# Patient Record
Sex: Female | Born: 1965 | Race: White | Hispanic: No | Smoking: Former smoker
Health system: Southern US, Community
[De-identification: ages and names within clinical notes are randomized; demographics above are authoritative.]

## PROBLEM LIST (undated history)

## (undated) DIAGNOSIS — G589 Mononeuropathy, unspecified: Secondary | ICD-10-CM

## (undated) DIAGNOSIS — F32A Depression, unspecified: Secondary | ICD-10-CM

## (undated) DIAGNOSIS — I10 Essential (primary) hypertension: Secondary | ICD-10-CM

## (undated) DIAGNOSIS — M1990 Unspecified osteoarthritis, unspecified site: Secondary | ICD-10-CM

## (undated) DIAGNOSIS — D219 Benign neoplasm of connective and other soft tissue, unspecified: Secondary | ICD-10-CM

## (undated) DIAGNOSIS — E119 Type 2 diabetes mellitus without complications: Secondary | ICD-10-CM

## (undated) DIAGNOSIS — F419 Anxiety disorder, unspecified: Secondary | ICD-10-CM

## (undated) DIAGNOSIS — N200 Calculus of kidney: Secondary | ICD-10-CM

## (undated) DIAGNOSIS — F329 Major depressive disorder, single episode, unspecified: Secondary | ICD-10-CM

## (undated) DIAGNOSIS — S149XXA Injury of unspecified nerves of neck, initial encounter: Secondary | ICD-10-CM

## (undated) DIAGNOSIS — K219 Gastro-esophageal reflux disease without esophagitis: Secondary | ICD-10-CM

## (undated) DIAGNOSIS — D649 Anemia, unspecified: Secondary | ICD-10-CM

## (undated) DIAGNOSIS — R202 Paresthesia of skin: Secondary | ICD-10-CM

## (undated) HISTORY — DX: Benign neoplasm of connective and other soft tissue, unspecified: D21.9

## (undated) HISTORY — DX: Anemia, unspecified: D64.9

## (undated) HISTORY — PX: ABDOMINAL HYSTERECTOMY: SHX81

## (undated) HISTORY — DX: Anxiety disorder, unspecified: F41.9

## (undated) HISTORY — PX: TUBAL LIGATION: SHX77

## (undated) HISTORY — DX: Major depressive disorder, single episode, unspecified: F32.9

## (undated) HISTORY — DX: Depression, unspecified: F32.A

## (undated) HISTORY — DX: Mononeuropathy, unspecified: G58.9

## (undated) HISTORY — DX: Paresthesia of skin: R20.2

## (undated) HISTORY — DX: Unspecified osteoarthritis, unspecified site: M19.90

## (undated) HISTORY — PX: OTHER SURGICAL HISTORY: SHX169

## (undated) HISTORY — PX: NECK SURGERY: SHX720

## (undated) HISTORY — DX: Injury of unspecified nerves of neck, initial encounter: S14.9XXA

## (undated) HISTORY — DX: Gastro-esophageal reflux disease without esophagitis: K21.9

---

## 2007-06-30 ENCOUNTER — Ambulatory Visit (HOSPITAL_COMMUNITY): Admission: RE | Admit: 2007-06-30 | Discharge: 2007-06-30 | Payer: Self-pay | Admitting: Family Medicine

## 2009-09-07 ENCOUNTER — Emergency Department (HOSPITAL_COMMUNITY): Admission: EM | Admit: 2009-09-07 | Discharge: 2009-09-07 | Payer: Self-pay | Admitting: Emergency Medicine

## 2014-08-04 ENCOUNTER — Emergency Department (HOSPITAL_COMMUNITY)
Admission: EM | Admit: 2014-08-04 | Discharge: 2014-08-05 | Disposition: A | Discharge status: Home / Self Care | Payer: PRIVATE HEALTH INSURANCE | Attending: Emergency Medicine | Admitting: Emergency Medicine

## 2014-08-04 ENCOUNTER — Emergency Department (HOSPITAL_COMMUNITY): Payer: PRIVATE HEALTH INSURANCE

## 2014-08-04 ENCOUNTER — Encounter (HOSPITAL_COMMUNITY): Payer: Self-pay | Admitting: Emergency Medicine

## 2014-08-04 ENCOUNTER — Other Ambulatory Visit (HOSPITAL_COMMUNITY): Payer: Self-pay | Admitting: Internal Medicine

## 2014-08-04 DIAGNOSIS — Z9889 Other specified postprocedural states: Secondary | ICD-10-CM | POA: Diagnosis not present

## 2014-08-04 DIAGNOSIS — Z90710 Acquired absence of both cervix and uterus: Secondary | ICD-10-CM | POA: Insufficient documentation

## 2014-08-04 DIAGNOSIS — K6289 Other specified diseases of anus and rectum: Secondary | ICD-10-CM | POA: Diagnosis not present

## 2014-08-04 DIAGNOSIS — R110 Nausea: Secondary | ICD-10-CM | POA: Diagnosis not present

## 2014-08-04 DIAGNOSIS — Z87442 Personal history of urinary calculi: Secondary | ICD-10-CM | POA: Diagnosis not present

## 2014-08-04 DIAGNOSIS — N949 Unspecified condition associated with female genital organs and menstrual cycle: Secondary | ICD-10-CM

## 2014-08-04 DIAGNOSIS — Z79899 Other long term (current) drug therapy: Secondary | ICD-10-CM | POA: Diagnosis not present

## 2014-08-04 DIAGNOSIS — R109 Unspecified abdominal pain: Secondary | ICD-10-CM | POA: Insufficient documentation

## 2014-08-04 HISTORY — DX: Calculus of kidney: N20.0

## 2014-08-04 LAB — BASIC METABOLIC PANEL
Anion gap: 11 (ref 5–15)
BUN: 10 mg/dL (ref 6–23)
CHLORIDE: 104 meq/L (ref 96–112)
CO2: 24 meq/L (ref 19–32)
Calcium: 8.8 mg/dL (ref 8.4–10.5)
Creatinine, Ser: 0.57 mg/dL (ref 0.50–1.10)
GFR calc Af Amer: >90 mL/min (ref >90)
GFR calc non Af Amer: >90 mL/min (ref >90)
GLUCOSE: 102 mg/dL — AB (ref 70–99)
POTASSIUM: 3.9 meq/L (ref 3.7–5.3)
SODIUM: 139 meq/L (ref 137–147)

## 2014-08-04 LAB — CBC WITH DIFFERENTIAL/PLATELET
Basophils Absolute: 0 10*3/uL (ref 0.0–0.1)
Basophils Relative: 1 % (ref 0–1)
Eosinophils Absolute: 0.2 10*3/uL (ref 0.0–0.7)
Eosinophils Relative: 2 % (ref 0–5)
HCT: 38.8 % (ref 36.0–46.0)
HEMOGLOBIN: 13.9 g/dL (ref 12.0–15.0)
LYMPHS ABS: 2.8 10*3/uL (ref 0.7–4.0)
LYMPHS PCT: 37 % (ref 12–46)
MCH: 31.2 pg (ref 26.0–34.0)
MCHC: 35.8 g/dL (ref 30.0–36.0)
MCV: 87.2 fL (ref 78.0–100.0)
Monocytes Absolute: 0.5 10*3/uL (ref 0.1–1.0)
Monocytes Relative: 6 % (ref 3–12)
NEUTROS PCT: 54 % (ref 43–77)
Neutro Abs: 4.1 10*3/uL (ref 1.7–7.7)
PLATELETS: 245 10*3/uL (ref 150–400)
RBC: 4.45 MIL/uL (ref 3.87–5.11)
RDW: 12.6 % (ref 11.5–15.5)
WBC: 7.6 10*3/uL (ref 4.0–10.5)

## 2014-08-04 MED ORDER — MORPHINE SULFATE 4 MG/ML IJ SOLN
4.0000 mg | INTRAMUSCULAR | Status: DC | PRN
Start: 2014-08-04 — End: 2014-08-05
  Administered 2014-08-04: 4 mg via INTRAVENOUS
  Filled 2014-08-04: qty 1

## 2014-08-04 MED ORDER — ONDANSETRON HCL 4 MG/2ML IJ SOLN
4.0000 mg | Freq: Once | INTRAMUSCULAR | Status: AC
  Administered 2014-08-04: 4 mg via INTRAVENOUS
  Filled 2014-08-04: qty 2

## 2014-08-04 NOTE — ED Provider Notes (Addendum)
CSN: [id number]     Arrival date & time 08/04/14  2101 History  This chart was scribed for Christy Furry, MD by Randa Evens, ED Scribe. This patient was seen in room APA05/APA05 and the patient's care was started at 9:37 PM.    Chief Complaint  Patient presents with  . Rectal Pain  . Abdominal Pain   Patient is a 48 y.o. female presenting with abdominal pain. The history is provided by the patient. No language interpreter was used.  Abdominal Pain Associated symptoms: nausea   Associated symptoms: no chest pain, no chills, no cough, no diarrhea, no dysuria, no fatigue, no fever, no hematuria, no shortness of breath, no sore throat and no vomiting    HPI Comments: Christy Russo is a 48 y.o. female who presents to the Emergency Department complaining of shooting rectal pain onset 6 days prior. She states she has been having associated abdominal pain and nausea.  She states she is having bowel movements. She states that her symptoms worsens with moving and sitting up. She states that laying down improves her symptoms. States she went to her PCP for UTI and the results were negative. States she was treated for UTI any way. She states she has taken a laxative earlier this week with no relief.  Denies diarrhea, blood in stool or fever.   Past Medical History  Diagnosis Date  . Kidney stones    Past Surgical History  Procedure Laterality Date  . Abdominal hysterectomy    . C-section x 2     History reviewed. No pertinent family history. History  Substance Use Topics  . Smoking status: Never Smoker   . Smokeless tobacco: Not on file  . Alcohol Use: Not on file   OB History   Grav Para Term Preterm Abortions TAB SAB Ect Mult Living                 Review of Systems  Constitutional: Negative for fever, chills, diaphoresis, appetite change and fatigue.  HENT: Negative for mouth sores, sore throat and trouble swallowing.   Eyes: Negative for visual disturbance.  Respiratory:  Negative for cough, chest tightness, shortness of breath and wheezing.   Cardiovascular: Negative for chest pain.  Gastrointestinal: Positive for nausea, abdominal pain and rectal pain. Negative for vomiting, diarrhea, blood in stool, abdominal distention and anal bleeding.  Endocrine: Negative for polydipsia, polyphagia and polyuria.  Genitourinary: Negative for dysuria, frequency and hematuria.  Musculoskeletal: Negative for gait problem.  Skin: Negative for color change, pallor and rash.  Neurological: Negative for dizziness, syncope, light-headedness and headaches.  Hematological: Does not bruise/bleed easily.  Psychiatric/Behavioral: Negative for behavioral problems and confusion.    Allergies  Review of patient's allergies indicates no known allergies.  Home Medications   Prior to Admission medications   Medication Sig Start Date End Date Taking? Authorizing Provider  ALPRAZolam Duanne Moron) 1 MG tablet Take 1 mg by mouth at bedtime.   Yes Historical Provider, MD  DULoxetine (CYMBALTA) 20 MG capsule Take 20 mg by mouth daily.   Yes Historical Provider, MD  oxyCODONE-acetaminophen (PERCOCET/ROXICET) 5-325 MG per tablet Take 2 tablets by mouth every 4 (four) hours as needed. 08/05/14   Christy Furry, MD   Triage Vitals: BP 129/87  Pulse 71  Temp(Src) 98.5 F (36.9 C) (Oral)  Resp 20  Ht 5\' 4"  (1.626 m)  Wt 148 lb (67.132 kg)  BMI 25.39 kg/m2  SpO2 100%  Physical Exam  Constitutional: She is oriented to  person, place, and time. She appears well-developed and well-nourished. No distress.  HENT:  Head: Normocephalic.  Eyes: Conjunctivae are normal. Pupils are equal, round, and reactive to light. No scleral icterus.  Neck: Normal range of motion. Neck supple. No thyromegaly present.  Cardiovascular: Normal rate and regular rhythm.  Exam reveals no gallop and no friction rub.   No murmur heard. Pulmonary/Chest: Effort normal and breath sounds normal. No respiratory distress. She has no  wheezes. She has no rales.  Abdominal: Soft. Bowel sounds are normal. She exhibits no distension. There is no rebound.  Reports Suprapubic pain but no tenderness on exam  Musculoskeletal: Normal range of motion.  Neurological: She is alert and oriented to person, place, and time.  Skin: Skin is warm and dry. No rash noted.  Psychiatric: She has a normal mood and affect. Her behavior is normal.    ED Course  Procedures (including critical care time) DIAGNOSTIC STUDIES: Oxygen Saturation is 100% on RA, Normal by my interpretation.    COORDINATION OF CARE:      Labs Review Labs Reviewed  BASIC METABOLIC PANEL - Abnormal; Notable for the following:    Glucose, Bld 102 (*)    All other components within normal limits  CBC WITH DIFFERENTIAL  URINALYSIS, ROUTINE W REFLEX MICROSCOPIC    Imaging Review Ct Abdomen Pelvis Wo Contrast  08/04/2014   CLINICAL DATA:  Perirectal pain and suprapubic pain. Treated for urinary tract infection earlier this week.  EXAM: CT ABDOMEN AND PELVIS WITHOUT CONTRAST  TECHNIQUE: Multidetector CT imaging of the abdomen and pelvis was performed following the standard protocol without IV contrast.  COMPARISON:  None.  FINDINGS: Lung bases are clear.  Kidneys appear symmetrical. No pyelocaliectasis or ureterectasis. No renal, ureteral, or bladder stones. Bladder is decompressed.  The unenhanced appearance of the liver, spleen, gallbladder, pancreas, adrenal glands, abdominal aorta, inferior vena cava, and retroperitoneal lymph nodes is unremarkable. There is a persistent left-sided inferior vena cava. This represents normal variation. The stomach and small bowel are decompressed. Stool-filled colon without distention. No free air or free fluid in the abdomen. Small accessory spleen.  Pelvis: The appendix is normal. No inflammatory changes to suggest diverticulitis. No free or loculated pelvic fluid collections. Uterus appears to be surgically absent. No pelvic mass or  lymphadenopathy. No destructive bone lesions.  IMPRESSION: No renal or ureteral stone or obstruction.   Electronically Signed   By: Lucienne Capers M.D.   On: 08/04/2014 23:53     EKG Interpretation None      MDM   Final diagnoses:  Abdominal pain, unspecified abdominal location    Specific cause noted for the patient's pain. Normal CT scan. Normal perirectal exam. No sign of abscess formation. Not constipated. No tears fissures or hemorrhoid noted. Think she is appropriate to be discharged to followup with her primary care physician for her pelvic ultrasound tomorrow.    I personally performed the services described in this documentation, which was scribed in my presence. The recorded information has been reviewed and is accurate.      Christy Furry, MD 08/05/14 0002  Christy Furry, MD 08/05/14 Dyann Kief

## 2014-08-04 NOTE — ED Notes (Signed)
Pt with rectal and abdominal pain. States she saw Dr. Gerarda Fraction on Thurs because she thought she had a "UTI or something". He prescribed her and ABX profolactically. Pt states she is feeling bloated. Pt states she has been taking laxatives thinking it would make her feel better but it hasn't. Pt descibes stabbing rectal pain. Denies blood in stool. Last BM today.

## 2014-08-05 ENCOUNTER — Ambulatory Visit (HOSPITAL_COMMUNITY)
Admission: RE | Admit: 2014-08-05 | Discharge: 2014-08-05 | Disposition: A | Discharge status: Home / Self Care | Payer: PRIVATE HEALTH INSURANCE | Source: Ambulatory Visit | Attending: Internal Medicine | Admitting: Internal Medicine

## 2014-08-05 DIAGNOSIS — N949 Unspecified condition associated with female genital organs and menstrual cycle: Secondary | ICD-10-CM | POA: Insufficient documentation

## 2014-08-05 DIAGNOSIS — N83209 Unspecified ovarian cyst, unspecified side: Secondary | ICD-10-CM | POA: Insufficient documentation

## 2014-08-05 DIAGNOSIS — N940 Mittelschmerz: Secondary | ICD-10-CM | POA: Diagnosis not present

## 2014-08-05 MED ORDER — OXYCODONE-ACETAMINOPHEN 5-325 MG PO TABS
1.0000 | ORAL_TABLET | Freq: Once | ORAL | Status: AC
  Administered 2014-08-05: 1 via ORAL

## 2014-08-05 MED ORDER — OXYCODONE-ACETAMINOPHEN 5-325 MG PO TABS
ORAL_TABLET | ORAL | Status: DC
Start: 2014-08-05 — End: 2014-08-05
  Filled 2014-08-05: qty 1

## 2014-08-05 MED ORDER — OXYCODONE-ACETAMINOPHEN 5-325 MG PO TABS: 2.0000 | ORAL_TABLET | ORAL | Status: DC | PRN

## 2014-08-05 NOTE — Discharge Instructions (Signed)
No specific cause was found for the abdominal pain tonight. Keep your appointment for your Ultrasound tomorrow. Recheck with Dr. Gerarda Fraction.  Abdominal Pain Many things can cause abdominal pain. Usually, abdominal pain is not caused by a disease and will improve without treatment. It can often be observed and treated at home. Your health care provider will do a physical exam and possibly order blood tests and X-rays to help determine the seriousness of your pain. However, in many cases, more time must pass before a clear cause of the pain can be found. Before that point, your health care provider may not know if you need more testing or further treatment. HOME CARE INSTRUCTIONS  Monitor your abdominal pain for any changes. The following actions may help to alleviate any discomfort you are experiencing:  Only take over-the-counter or prescription medicines as directed by your health care provider.  Do not take laxatives unless directed to do so by your health care provider.  Try a clear liquid diet (broth, tea, or water) as directed by your health care provider. Slowly move to a bland diet as tolerated. SEEK MEDICAL CARE IF:  You have unexplained abdominal pain.  You have abdominal pain associated with nausea or diarrhea.  You have pain when you urinate or have a bowel movement.  You experience abdominal pain that wakes you in the night.  You have abdominal pain that is worsened or improved by eating food.  You have abdominal pain that is worsened with eating fatty foods.  You have a fever. SEEK IMMEDIATE MEDICAL CARE IF:   Your pain does not go away within 2 hours.  You keep throwing up (vomiting).  Your pain is felt only in portions of the abdomen, such as the right side or the left lower portion of the abdomen.  You pass bloody or black tarry stools. MAKE SURE YOU:  Understand these instructions.   Will watch your condition.   Will get help right away if you are not doing  well or get worse.  Document Released: 09/05/2005 Document Revised: 12/01/2013 Document Reviewed: 08/05/2013 [facility] Patient Information 2015 [place], [place]. This information is not intended to replace advice given to you by your health care provider. Make sure you discuss any questions you have with your health care provider.

## 2014-08-06 ENCOUNTER — Other Ambulatory Visit (HOSPITAL_COMMUNITY): Payer: Self-pay

## 2015-01-10 ENCOUNTER — Ambulatory Visit (HOSPITAL_COMMUNITY)
Admission: RE | Admit: 2015-01-10 | Discharge: 2015-01-10 | Disposition: A | Discharge status: Home / Self Care | Payer: PRIVATE HEALTH INSURANCE | Source: Ambulatory Visit | Attending: Family Medicine | Admitting: Family Medicine

## 2015-01-10 ENCOUNTER — Other Ambulatory Visit (HOSPITAL_COMMUNITY): Payer: Self-pay | Admitting: Family Medicine

## 2015-01-10 DIAGNOSIS — R059 Cough, unspecified: Secondary | ICD-10-CM

## 2015-01-10 DIAGNOSIS — R05 Cough: Secondary | ICD-10-CM | POA: Diagnosis present

## 2015-02-01 ENCOUNTER — Institutional Professional Consult (permissible substitution): Payer: Self-pay | Admitting: Neurology

## 2016-03-05 ENCOUNTER — Encounter: Payer: Self-pay | Admitting: Podiatry

## 2016-03-05 ENCOUNTER — Ambulatory Visit (INDEPENDENT_AMBULATORY_CARE_PROVIDER_SITE_OTHER): Payer: BLUE CROSS/BLUE SHIELD | Admitting: Podiatry

## 2016-03-05 ENCOUNTER — Ambulatory Visit: Payer: BLUE CROSS/BLUE SHIELD

## 2016-03-05 DIAGNOSIS — R52 Pain, unspecified: Secondary | ICD-10-CM

## 2016-03-05 DIAGNOSIS — M2042 Other hammer toe(s) (acquired), left foot: Secondary | ICD-10-CM | POA: Diagnosis not present

## 2016-03-05 NOTE — Patient Instructions (Signed)

## 2016-03-05 NOTE — Progress Notes (Signed)
Subjective:    Patient ID: Christy Russo, female    DOB: 1966-02-27, 50 y.o.   MRN: [id number]  HPI  50 year old female presents the also concerns of pain to the left third toe. She states that she has a history of a fracture to the third toe due to a fall. At that time she did not go to the doctor until after some. She was given a splint however did not help. She is also tried shoe changes and offloading padding. Since that she has noticed the tip of her left third toe is curling and she is having pain daily basis with pressure and weightbearing. She also gets numbness the tip of the toe.   She is currently being treated by Dr. Gershon Mussel for a left ankle injury. She is wearing a brace. She's had 2 injections by him. She just had one in the last couple of days.   Review of Systems  All other systems reviewed and are negative.      Objective:   Physical Exam General: AAO x3, NAD  Dermatological: Skin is warm, dry and supple bilateral. Nails x 10 are well manicured; remaining integument appears unremarkable at this time. There are no open sores, no preulcerative lesions, no rash or signs of infection present.  Vascular: Dorsalis Pedis artery and Posterior Tibial artery pedal pulses are 2/4 bilateral with immedate capillary fill time. Pedal hair growth present. No varicosities and no lower extremity edema present bilateral. There is no pain with calf compression, swelling, warmth, erythema.   Neruologic: Grossly intact via light touch bilateral. Vibratory intact via tuning fork bilateral. Protective threshold with Semmes Wienstein monofilament intact to all pedal sites bilateral. Patellar and Achilles deep tendon reflexes 2+ bilateral. No Babinski or clonus noted bilateral.   Musculoskeletal: The left third toe has flexion contracture present at the DIPJ and is laterally deviated. There is tenderness to palpation on the distal aspect of the toe as she is walking on the very distal part of  the toe. There is no swelling or erythema. No open lesions. There is tenderness along the lateral aspect of left ankle without any pinpoint bony tenderness. There is bruising from recent injection. MMT 5/5.  Gait: Unassisted, Nonantalgic.       Assessment & Plan:  50 year old female left third symptomatic hammertoe -Treatment options discussed including all alternatives, risks, and complications -Etiology of symptoms were discussed -X-rays were obtained and reviewed with the patient. The patient be subluxation of the DIPJ of the left third digit. -At this time a discussed both conservative and surgical treatment options. This time she has attempted conservative treatment including bracing, offloading padding pain relief of symptoms. At this time she is requesting surgical intervention. I discussed with her left third digit DIPJ arthroplasty. -The incision placement as well as the postoperative course was discussed with the patient. I discussed risks of the surgery which include, but not limited to, infection, bleeding, pain, swelling, need for further surgery, delayed or nonhealing, painful or ugly scar, numbness or sensation changes, over/under correction, recurrence, transfer lesions, further deformity, hardware failure, DVT/PE, loss of toe/foot. Patient understands these risks and wishes to proceed with surgery. The surgical consent was reviewed with the patient all 3 pages were signed. No promises or guarantees were given to the outcome of the procedure. All questions were answered to the best of my ability. Before the surgery the patient was encouraged to call the office if there is any further questions. The surgery will be  performed at the [facility] on an outpatient basis. -Continue follow with Dr. Grandville Silos left ankle injury. We'll defer treatment to him for this.  Celesta Gentile, DPM

## 2016-03-06 DIAGNOSIS — M2040 Other hammer toe(s) (acquired), unspecified foot: Secondary | ICD-10-CM | POA: Insufficient documentation

## 2016-03-09 ENCOUNTER — Telehealth: Payer: Self-pay | Admitting: *Deleted

## 2016-03-09 DIAGNOSIS — M2042 Other hammer toe(s) (acquired), left foot: Secondary | ICD-10-CM

## 2016-03-09 NOTE — Telephone Encounter (Signed)
"  I was there on Monday and scheduled surgery for April 12.  I still haven't heard from the surgery center about the time."  Surgical center will call you Friday or Monday prior to surgery with the arrival time.  "Okay, thank you so much."

## 2016-03-21 ENCOUNTER — Encounter: Payer: Self-pay | Admitting: Podiatry

## 2016-03-21 ENCOUNTER — Telehealth: Payer: Self-pay | Admitting: *Deleted

## 2016-03-21 DIAGNOSIS — M2042 Other hammer toe(s) (acquired), left foot: Secondary | ICD-10-CM | POA: Diagnosis not present

## 2016-03-21 HISTORY — PX: TOE SURGERY: SHX1073

## 2016-03-21 NOTE — Telephone Encounter (Addendum)
Pt states her dislocated surgery toe is very itchy is that normal.  I told pt that may be the sensation she has to the numbness leaving the toe, to watch for rash, swelling and could use the OTC Benadryl for the itching.  04/04/2016-Pt states she was in office 04/02/2016 and the dressing felt tight at that time but she thought it would loosen, but it hasn't and she is having burning in toes and the bottom of her foot that didn't have surgery.  I told pt she could loosen the Coflex by making a knick in the area where it was tight, or could remove the Coflex and rewrap looser and tape if needed.  Pt states understanding and will come in for a dressing change after seeing Dr. Gershon Mussel tomorrow to pick up orthotics.  04/05/2016-Pt states she wasn't able to sleep last night, due to the burning across the bottom of the toes, the toes aren't as cramped since she loosened the Coflex, but the burning has worsened.  Transferred to schedulers for an appt today.  04/11/2016-Mr. Skinner, pt's employer states he is calling to expedite pt's Short-term Disability, which is with [employer], which needs confirmation of the surgery date.  Please call Mr. Polly Cobia as well. 04/18/2016-Pt request call from [place] at her convenience.  04/23/2016-Pt request Gretta Arab call at her convenience.

## 2016-03-26 ENCOUNTER — Encounter: Payer: Self-pay | Admitting: Podiatry

## 2016-03-28 ENCOUNTER — Ambulatory Visit (INDEPENDENT_AMBULATORY_CARE_PROVIDER_SITE_OTHER): Payer: BLUE CROSS/BLUE SHIELD | Admitting: Podiatry

## 2016-03-28 ENCOUNTER — Ambulatory Visit (INDEPENDENT_AMBULATORY_CARE_PROVIDER_SITE_OTHER): Payer: BLUE CROSS/BLUE SHIELD

## 2016-03-28 ENCOUNTER — Encounter: Payer: Self-pay | Admitting: Podiatry

## 2016-03-28 VITALS — Temp 97.9°F

## 2016-03-28 DIAGNOSIS — Z9889 Other specified postprocedural states: Secondary | ICD-10-CM

## 2016-03-28 DIAGNOSIS — M2042 Other hammer toe(s) (acquired), left foot: Secondary | ICD-10-CM | POA: Diagnosis not present

## 2016-03-28 NOTE — Progress Notes (Signed)
DOS 03/21/2016 Left 3rd toe, hammer toe repair.

## 2016-03-28 NOTE — Progress Notes (Signed)
Subjective:     Patient ID: Christy Russo, female   DOB: Mar 07, 1966, 50 y.o.   MRN: [id number]  HPI patient states the pin has started to come out of the toe third left and I was concerned   Review of Systems     Objective:   Physical Exam Neurovascular status intact negative Homans sign was noted with wound edges well coapted distal third digit left in a transverse fashion with the pin that has moved out by approximately one half of the centimeter    Assessment:     Distal movement or toe pin secondary to trauma    Plan:     X-ray reviewed and I did move the pin and slightly and tried to bend it but it simply was not enough room to do this. I applied a lot of padding underneath it reappoint for Dr. Jacqualyn Posey to see in a week and we'll probably need to be removed in the next few weeks  X-ray report indicated the pin is still across the joint surface and is still holding the toe in a stable position

## 2016-04-02 ENCOUNTER — Ambulatory Visit (INDEPENDENT_AMBULATORY_CARE_PROVIDER_SITE_OTHER): Payer: BLUE CROSS/BLUE SHIELD | Admitting: Podiatry

## 2016-04-02 DIAGNOSIS — M2042 Other hammer toe(s) (acquired), left foot: Secondary | ICD-10-CM | POA: Diagnosis not present

## 2016-04-02 DIAGNOSIS — Z9889 Other specified postprocedural states: Secondary | ICD-10-CM | POA: Diagnosis not present

## 2016-04-02 NOTE — Progress Notes (Signed)
Patient ID: AMUNIQUE KOKES, female   DOB: [birth year], 50 y.o.   MRN: [id number]  Subjective: Christy Russo is a 50 y.o. is seen today in office s/p left 3rd hammertoe repair. She states that her pain is controlled and she is not taking pain medicine at this time. She has continued with the surgical shoe. Denies any systemic complaints such as fevers, chills, nausea, vomiting. No calf pain, chest pain, shortness of breath.   Objective: General: No acute distress, AAOx3  DP/PT pulses palpable 2/4, CRT < 3 sec to all digits.  Protective sensation intact. Motor function intact.  Left foot: Incision is well coapted without any evidence of dehiscence and sutures intact. K-wire intact There is no surrounding erythema, ascending cellulitis, fluctuance, crepitus, malodor, drainage/purulence. There is mild edema around the surgical site. There is no pain along the surgical site.  No other areas of tenderness to bilateral lower extremities.  No other open lesions or pre-ulcerative lesions.  No pain with calf compression, swelling, warmth, erythema.   Assessment and Plan:  Status post left 3rd Hammertoe repair, doing well with no complications   -Treatment options discussed including all alternatives, risks, and complications -Sutures removed without complications. Antibiotic ointment applied followed by DSD. Keep clean, dry, intact -Surgical shoe, WBAT -Ice/elevation -Pain medication as needed. -Monitor for any clinical signs or symptoms of infection and DVT/PE and directed to call the office immediately should any occur or go to the ER. -Follow-up in 2 weeks or sooner if any problems arise. In the meantime, encouraged to call the office with any questions, concerns, change in symptoms.  *x-ray, pin removal next appointment  Celesta Gentile, DPM

## 2016-04-05 ENCOUNTER — Encounter: Payer: Self-pay | Admitting: Podiatry

## 2016-04-05 ENCOUNTER — Ambulatory Visit (INDEPENDENT_AMBULATORY_CARE_PROVIDER_SITE_OTHER): Payer: BLUE CROSS/BLUE SHIELD | Admitting: Podiatry

## 2016-04-05 ENCOUNTER — Ambulatory Visit: Payer: Self-pay

## 2016-04-05 VITALS — BP 136/83 | HR 64 | Resp 12

## 2016-04-05 DIAGNOSIS — Z9889 Other specified postprocedural states: Secondary | ICD-10-CM

## 2016-04-05 DIAGNOSIS — M2042 Other hammer toe(s) (acquired), left foot: Secondary | ICD-10-CM

## 2016-04-08 NOTE — Progress Notes (Signed)
Subjective:     Patient ID: Christy Russo, female   DOB: 06/25/1966, 50 y.o.   MRN: [id number]  HPI patient presents concerned about some burning in the bottom of her left foot and she wanted to make sure everything was okay   Review of Systems     Objective:   Physical Exam Several weeks after surgery with Dr. Jacqualyn Posey and I noted the incision sites healing well with minimal edema no erythema no drainage noted wound edges are well coapted and toe is in good alignment with pin in place. Did not note any significant plantar pain I did not note any proximal edema erythema or drainage noted    Assessment:     Appears to be more inflammatory in nature secondary to activity levels but does not appear to have any other indication of issues    Plan:     Reapplied sterile dressing and instructed her on gradually getting the foot wet but not bearing weight on this part of her foot without boot her shoe. Patient be seen back for regular visit by Dr. Jacqualyn Posey or earlier if needed

## 2016-04-12 ENCOUNTER — Telehealth: Payer: Self-pay | Admitting: *Deleted

## 2016-04-12 NOTE — Telephone Encounter (Signed)
"  I need someone to get on the ball about my disability.  My company said they have called several times and haven't received a call.  I had surgery 3 weeks ago.  They have been calling to verify that I had surgery and have some other questions.  I need this taken care of."  Janett Billow said she hasn't received any calls from anyone.  She said she is caught up.  "Is that the nurse Jessica?"  Yes, that's Dr. Mellody Drown nurse.  "I know her, I know she wouldn't make this up.  I know it's my job.  Can you get her to call me back?"  I sure will.

## 2016-04-13 ENCOUNTER — Encounter (HOSPITAL_COMMUNITY): Payer: Self-pay | Admitting: Emergency Medicine

## 2016-04-13 ENCOUNTER — Emergency Department (HOSPITAL_COMMUNITY)
Admission: EM | Admit: 2016-04-13 | Discharge: 2016-04-14 | Disposition: A | Discharge status: Other Acute Inpatient Hospital | Payer: BLUE CROSS/BLUE SHIELD | Attending: Emergency Medicine | Admitting: Emergency Medicine

## 2016-04-13 DIAGNOSIS — M79672 Pain in left foot: Secondary | ICD-10-CM | POA: Insufficient documentation

## 2016-04-13 DIAGNOSIS — T424X2A Poisoning by benzodiazepines, intentional self-harm, initial encounter: Secondary | ICD-10-CM | POA: Diagnosis present

## 2016-04-13 DIAGNOSIS — T50902A Poisoning by unspecified drugs, medicaments and biological substances, intentional self-harm, initial encounter: Secondary | ICD-10-CM

## 2016-04-13 LAB — CBC WITH DIFFERENTIAL/PLATELET
BASOS ABS: 0 10*3/uL (ref 0.0–0.1)
BASOS PCT: 0 %
EOS ABS: 0.2 10*3/uL (ref 0.0–0.7)
EOS PCT: 2 %
HCT: 40.6 % (ref 36.0–46.0)
Hemoglobin: 14.1 g/dL (ref 12.0–15.0)
LYMPHS PCT: 35 %
Lymphs Abs: 2.5 10*3/uL (ref 0.7–4.0)
MCH: 30.1 pg (ref 26.0–34.0)
MCHC: 34.7 g/dL (ref 30.0–36.0)
MCV: 86.8 fL (ref 78.0–100.0)
Monocytes Absolute: 0.4 10*3/uL (ref 0.1–1.0)
Monocytes Relative: 6 %
Neutro Abs: 4 10*3/uL (ref 1.7–7.7)
Neutrophils Relative %: 57 %
PLATELETS: 314 10*3/uL (ref 150–400)
RBC: 4.68 MIL/uL (ref 3.87–5.11)
RDW: 12.6 % (ref 11.5–15.5)
WBC: 7.1 10*3/uL (ref 4.0–10.5)

## 2016-04-13 LAB — RAPID URINE DRUG SCREEN, HOSP PERFORMED
AMPHETAMINES: NOT DETECTED
BENZODIAZEPINES: POSITIVE — AB
Barbiturates: NOT DETECTED
Cocaine: NOT DETECTED
OPIATES: NOT DETECTED
Tetrahydrocannabinol: NOT DETECTED

## 2016-04-13 LAB — COMPREHENSIVE METABOLIC PANEL
ALT: 47 U/L (ref 14–54)
AST: 27 U/L (ref 15–41)
Albumin: 4.2 g/dL (ref 3.5–5.0)
Alkaline Phosphatase: 58 U/L (ref 38–126)
Anion gap: 11 (ref 5–15)
BILIRUBIN TOTAL: 0.4 mg/dL (ref 0.3–1.2)
BUN: 14 mg/dL (ref 6–20)
CALCIUM: 9.2 mg/dL (ref 8.9–10.3)
CHLORIDE: 107 mmol/L (ref 101–111)
CO2: 23 mmol/L (ref 22–32)
CREATININE: 0.64 mg/dL (ref 0.44–1.00)
Glucose, Bld: 106 mg/dL — ABNORMAL HIGH (ref 65–99)
Potassium: 3.6 mmol/L (ref 3.5–5.1)
SODIUM: 141 mmol/L (ref 135–145)
TOTAL PROTEIN: 7.4 g/dL (ref 6.5–8.1)

## 2016-04-13 LAB — ACETAMINOPHEN LEVEL: Acetaminophen (Tylenol), Serum: <10 ug/mL — ABNORMAL LOW (ref 10–30)

## 2016-04-13 LAB — ETHANOL: ALCOHOL ETHYL (B): 84 mg/dL — AB (ref <5)

## 2016-04-13 MED ORDER — GI COCKTAIL ~~LOC~~
30.0000 mL | Freq: Once | ORAL | Status: AC
  Administered 2016-04-13: 30 mL via ORAL
  Filled 2016-04-13: qty 30

## 2016-04-13 MED ORDER — ONDANSETRON 8 MG PO TBDP
8.0000 mg | ORAL_TABLET | Freq: Once | ORAL | Status: AC
  Administered 2016-04-13: 8 mg via ORAL
  Filled 2016-04-13: qty 1

## 2016-04-13 NOTE — ED Provider Notes (Signed)
CSN: [id number]     Arrival date & time 04/13/16  1937 History   First MD Initiated Contact with Patient 04/13/16 1953     Chief Complaint  Patient presents with  . V70.1     (Consider location/radiation/quality/duration/timing/severity/associated sxs/prior Treatment) HPI  50 year old female who presents today stating that she has taken multiple Xanax and drank a bottle of wine in an attempt to harm herself. She is unclear how many Xanax she took. She has a bottle of 120 that she received on April 27. There are 27 remaining pills. She takes 1 mg up to 3 times per day which relieves approximately 70 pills unaccounted for. Since that she took several Tylenol this morning. She is not able to give me a timeframe for this Xanax stating that it occurred throughout the day. She denies taking any other medications. She denies any previous attempt to harm herself. She states that she got argument with her daughters which is why she took the Xanax and drinking alcohol. She has recently had surgery on her toe on her left foot. She states this has been progressing well and she has followed up with her foot surgeon. She denies any increased pain, redness, or swelling at the site. Past Medical History  Diagnosis Date  . Kidney stones    Past Surgical History  Procedure Laterality Date  . Abdominal hysterectomy    . C-section x 2     History reviewed. No pertinent family history. Social History  Substance Use Topics  . Smoking status: Never Smoker   . Smokeless tobacco: Never Used  . Alcohol Use: Yes     Comment: Occasionally   OB History    No data available     Review of Systems  All other systems reviewed and are negative.     Allergies  Review of patient's allergies indicates no known allergies.  Home Medications   Prior to Admission medications   Medication Sig Start Date End Date Taking? Authorizing Provider  ALPRAZolam Duanne Moron) 1 MG tablet Take 1 mg by mouth at bedtime.     Historical Provider, MD  DULoxetine (CYMBALTA) 20 MG capsule Take 20 mg by mouth daily.    Historical Provider, MD  HYDROcodone-acetaminophen (NORCO/VICODIN) 5-325 MG tablet Take 1 tablet by mouth every 6 (six) hours as needed for moderate pain (one tablet every 4-6 hours prn foot pain).    Trula Slade, DPM  oxyCODONE-acetaminophen (PERCOCET/ROXICET) 5-325 MG per tablet Take 2 tablets by mouth every 4 (four) hours as needed. 08/05/14   Tanna Furry, MD  promethazine (PHENERGAN) 12.5 MG tablet Take 12.5 mg by mouth every 6 (six) hours as needed for nausea or vomiting.    Trula Slade, DPM   BP 123/82 mmHg  Pulse 85  Temp(Src) 98.3 F (36.8 C) (Oral)  Resp 16  Ht 5\' 5"  (1.651 m)  Wt 68.04 kg  BMI 24.96 kg/m2  SpO2 96% Physical Exam  Constitutional: She is oriented to person, place, and time. She appears well-developed and well-nourished.  HENT:  Head: Normocephalic and atraumatic.  Right Ear: External ear normal.  Left Ear: External ear normal.  Nose: Nose normal.  Mouth/Throat: Oropharynx is clear and moist.  Eyes: Conjunctivae and EOM are normal. Pupils are equal, round, and reactive to light.  Neck: Normal range of motion. Neck supple.  Cardiovascular: Normal rate, regular rhythm, normal heart sounds and intact distal pulses.   Pulmonary/Chest: Effort normal and breath sounds normal.  Abdominal: Soft. Bowel sounds are  normal.  Musculoskeletal: Normal range of motion.  Fourth toe left foot with pain extending out distal aspect. No significant swelling, redness, warmth, or tenderness.  Neurological: She is alert and oriented to person, place, and time. She has normal reflexes.  Skin: Skin is warm and dry.  Psychiatric: She has a normal mood and affect. Her behavior is normal. Judgment and thought content normal.  Nursing note and vitals reviewed.   ED Course  Procedures (including critical care time) Labs Review Labs Reviewed  CBC WITH DIFFERENTIAL/PLATELET   COMPREHENSIVE METABOLIC PANEL  ETHANOL  URINE RAPID DRUG SCREEN, HOSP PERFORMED  ACETAMINOPHEN LEVEL    Imaging Review No results found. I have personally reviewed and evaluated these images and lab results as part of my medical decision-making. ED ECG REPORT   Date: 04/13/2016  Rate: 89  Rhythm: normal sinus rhythm  QRS Axis: left  Intervals: normal  ST/T Wave abnormalities: normal  Conduction Disutrbances:none  Narrative Interpretation:   Old EKG Reviewed: none available  I have personally reviewed the EKG tracing and agree with the computerized printout as noted.   MDM   Final diagnoses:  Overdose, intentional self-harm, initial encounter [facility])    50 year old female presents today with intentional polysubstance overdose. She's been awake and alert here. Lab workup is significant for urine drug screen positive for benzodiazepines and alcohol level. Both consistent with what she states she took. She also reported that she took Tylenol this morning but Tylenol level is not elevated. TTS consult is pending.    Pattricia Boss, MD 04/13/16 2142

## 2016-04-13 NOTE — ED Notes (Signed)
Patient is tearful and have drank a bottle wine and approximately, (40-50)- 1mg  Xanax, (4) Tylenol PM tablets. States "My daughter hates me, I want to end my life by taking Xanax and drinking bottle of wine".

## 2016-04-13 NOTE — ED Notes (Signed)
Spoke to [place] at [employer] regarding the medications that pt admits to taking.  Per Poison Control recommendations, pt should be monitored for at least 6 hours on cardiac monitor, IV fluids, EKG, check tylenol level, cmp.  Watch for respiratory depression etc.

## 2016-04-14 ENCOUNTER — Inpatient Hospital Stay (HOSPITAL_COMMUNITY)
Admission: AD | Admit: 2016-04-14 | Discharge: 2016-04-17 | DRG: 885 | Disposition: A | Discharge status: Home / Self Care | Payer: BLUE CROSS/BLUE SHIELD | Source: Intra-hospital | Attending: Psychiatry | Admitting: Psychiatry

## 2016-04-14 ENCOUNTER — Encounter (HOSPITAL_COMMUNITY): Payer: Self-pay | Admitting: *Deleted

## 2016-04-14 DIAGNOSIS — G4700 Insomnia, unspecified: Secondary | ICD-10-CM | POA: Diagnosis present

## 2016-04-14 DIAGNOSIS — F331 Major depressive disorder, recurrent, moderate: Secondary | ICD-10-CM | POA: Diagnosis present

## 2016-04-14 DIAGNOSIS — Z915 Personal history of self-harm: Secondary | ICD-10-CM

## 2016-04-14 DIAGNOSIS — R45851 Suicidal ideations: Secondary | ICD-10-CM | POA: Diagnosis present

## 2016-04-14 DIAGNOSIS — F1320 Sedative, hypnotic or anxiolytic dependence, uncomplicated: Secondary | ICD-10-CM | POA: Diagnosis present

## 2016-04-14 DIAGNOSIS — F1020 Alcohol dependence, uncomplicated: Secondary | ICD-10-CM | POA: Diagnosis present

## 2016-04-14 DIAGNOSIS — T424X2A Poisoning by benzodiazepines, intentional self-harm, initial encounter: Secondary | ICD-10-CM | POA: Diagnosis not present

## 2016-04-14 DIAGNOSIS — F411 Generalized anxiety disorder: Secondary | ICD-10-CM | POA: Diagnosis present

## 2016-04-14 MED ORDER — IBUPROFEN 600 MG PO TABS
600.0000 mg | ORAL_TABLET | Freq: Four times a day (QID) | ORAL | Status: DC | PRN
  Administered 2016-04-14 – 2016-04-17 (×7): 600 mg via ORAL
  Filled 2016-04-14 (×7): qty 1

## 2016-04-14 MED ORDER — LORAZEPAM 1 MG PO TABS
1.0000 mg | ORAL_TABLET | Freq: Every day | ORAL | Status: DC
Start: 2016-04-18 — End: 2016-04-18

## 2016-04-14 MED ORDER — LORAZEPAM 1 MG PO TABS
1.0000 mg | ORAL_TABLET | Freq: Two times a day (BID) | ORAL | Status: AC
Start: 2016-04-16 — End: 2016-04-17
  Administered 2016-04-16 – 2016-04-17 (×2): 1 mg via ORAL
  Filled 2016-04-14 (×2): qty 1

## 2016-04-14 MED ORDER — LORAZEPAM 1 MG PO TABS
1.0000 mg | ORAL_TABLET | Freq: Three times a day (TID) | ORAL | Status: AC
Start: 2016-04-15 — End: 2016-04-16
  Administered 2016-04-15 – 2016-04-16 (×3): 1 mg via ORAL
  Filled 2016-04-14 (×3): qty 1

## 2016-04-14 MED ORDER — ALUM & MAG HYDROXIDE-SIMETH 200-200-20 MG/5ML PO SUSP: 30.0000 mL | ORAL | Status: DC | PRN

## 2016-04-14 MED ORDER — MAGNESIUM HYDROXIDE 400 MG/5ML PO SUSP
30.0000 mL | Freq: Every day | ORAL | Status: DC | PRN
  Administered 2016-04-15 – 2016-04-16 (×2): 30 mL via ORAL
  Filled 2016-04-14 (×2): qty 30

## 2016-04-14 MED ORDER — LOPERAMIDE HCL 2 MG PO CAPS: 2.0000 mg | ORAL_CAPSULE | ORAL | Status: AC | PRN

## 2016-04-14 MED ORDER — HYDROXYZINE HCL 25 MG PO TABS
25.0000 mg | ORAL_TABLET | Freq: Four times a day (QID) | ORAL | Status: AC | PRN
  Administered 2016-04-15 – 2016-04-16 (×2): 25 mg via ORAL
  Filled 2016-04-14 (×2): qty 1

## 2016-04-14 MED ORDER — IBUPROFEN 800 MG PO TABS
800.0000 mg | ORAL_TABLET | Freq: Once | ORAL | Status: AC
  Administered 2016-04-14: 800 mg via ORAL
  Filled 2016-04-14: qty 1

## 2016-04-14 MED ORDER — TRAZODONE HCL 50 MG PO TABS
50.0000 mg | ORAL_TABLET | Freq: Every evening | ORAL | Status: DC | PRN
  Administered 2016-04-15 – 2016-04-16 (×3): 50 mg via ORAL
  Filled 2016-04-14 (×3): qty 1

## 2016-04-14 MED ORDER — LORAZEPAM 1 MG PO TABS
1.0000 mg | ORAL_TABLET | Freq: Four times a day (QID) | ORAL | Status: AC
Start: 2016-04-14 — End: 2016-04-15
  Administered 2016-04-14 – 2016-04-15 (×4): 1 mg via ORAL
  Filled 2016-04-14 (×4): qty 1

## 2016-04-14 MED ORDER — LORAZEPAM 1 MG PO TABS: 1.0000 mg | ORAL_TABLET | Freq: Four times a day (QID) | ORAL | Status: AC | PRN

## 2016-04-14 MED ORDER — ADULT MULTIVITAMIN W/MINERALS CH
1.0000 | ORAL_TABLET | Freq: Every day | ORAL | Status: DC
  Administered 2016-04-15 – 2016-04-17 (×3): 1 via ORAL
  Filled 2016-04-14 (×6): qty 1

## 2016-04-14 MED ORDER — ACETAMINOPHEN 325 MG PO TABS
650.0000 mg | ORAL_TABLET | Freq: Four times a day (QID) | ORAL | Status: DC | PRN
  Filled 2016-04-14: qty 2

## 2016-04-14 MED ORDER — VITAMIN B-1 100 MG PO TABS
100.0000 mg | ORAL_TABLET | Freq: Every day | ORAL | Status: DC
  Administered 2016-04-15 – 2016-04-17 (×3): 100 mg via ORAL
  Filled 2016-04-14 (×5): qty 1

## 2016-04-14 MED ORDER — ONDANSETRON 4 MG PO TBDP: 4.0000 mg | ORAL_TABLET | Freq: Four times a day (QID) | ORAL | Status: AC | PRN
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
## 2016-04-14 NOTE — Progress Notes (Signed)
Patient's mood is very labile. She is very tearful when speaking of her estranged relationship with her daughter. Not very forthcoming with the reason for their estranged relationship besides blaming it on her daughter's mother in law.  She rates Anxiety and Depression 10/10. Her L foot pain is 10/10. Her goal is "to just get out here. I need to go home. I don't belong here." Denies SI/HI/AVH. Encouragement and support given. Medications administered as prescribed. Continue Q 15 minute checks for patient safety and medication effectiveness.

## 2016-04-14 NOTE — Progress Notes (Signed)
Patient accepted to [facility] 304-2, Dr. Sabra Heck is accepting provider, [phone] to call report.  [place] Disposition CSW [phone]

## 2016-04-14 NOTE — Progress Notes (Signed)
Patient did not attend the evening speaker [place] meeting. Pt reported not being here for substance abuse and remained in her bed. Pt had just recently been admitted to the unit and was getting her bandaged washed for her foot.

## 2016-04-14 NOTE — Tx Team (Signed)
Initial Interdisciplinary Treatment Plan   PATIENT STRESSORS: Educational concerns Financial difficulties Health problems Legal issue   PATIENT STRENGTHS: Ability for insight Active sense of humor Average or above average intelligence   PROBLEM LIST: Problem List/Patient Goals Date to be addressed Date deferred Reason deferred Estimated date of resolution  Depression wth SI 04/14/16     Xanax Abuse 04/14/16                             " I ove my daughter and granddaughter" 04/14/16     " I'm afraid here" 04/14/16            DISCHARGE CRITERIA:  Ability to meet basic life and health needs Adequate post-discharge living arrangements Improved stabilization in mood, thinking, and/or behavior Medical problems require only outpatient monitoring  PRELIMINARY DISCHARGE PLAN: Attend aftercare/continuing care group Attend PHP/IOP Attend 12-step recovery group  PATIENT/FAMIILY INVOLVEMENT: This treatment plan has been presented to and reviewed with the patient, Christy Russo, and/or family member,   The patient and family have been given the opportunity to ask questions and make suggestions.  Lauralyn Primes 04/14/2016, 6:36 PM

## 2016-04-15 ENCOUNTER — Encounter (HOSPITAL_COMMUNITY): Payer: Self-pay | Admitting: Psychiatry

## 2016-04-15 DIAGNOSIS — F411 Generalized anxiety disorder: Secondary | ICD-10-CM | POA: Diagnosis present

## 2016-04-15 DIAGNOSIS — F1320 Sedative, hypnotic or anxiolytic dependence, uncomplicated: Secondary | ICD-10-CM

## 2016-04-15 DIAGNOSIS — F331 Major depressive disorder, recurrent, moderate: Principal | ICD-10-CM

## 2016-04-15 DIAGNOSIS — F1020 Alcohol dependence, uncomplicated: Secondary | ICD-10-CM

## 2016-04-15 DIAGNOSIS — R45851 Suicidal ideations: Secondary | ICD-10-CM

## 2016-04-15 MED ORDER — DULOXETINE HCL 60 MG PO CPEP
60.0000 mg | ORAL_CAPSULE | Freq: Every day | ORAL | Status: DC
  Administered 2016-04-15 – 2016-04-17 (×3): 60 mg via ORAL
  Filled 2016-04-15 (×5): qty 1

## 2016-04-15 NOTE — BHH Suicide Risk Assessment (Signed)
[facility] Admission Suicide Risk Assessment   Nursing information obtained from:    Demographic factors:    Current Mental Status:    Loss Factors:    Historical Factors:    Risk Reduction Factors:     Total Time spent with patient: 45 minutes Principal Problem: <principal problem not specified> Diagnosis:   Patient Active Problem List   Diagnosis Date Noted  . Alcohol use disorder, moderate, dependence ([place]) [F10.20] 04/14/2016  . [place] [M20.40] 03/06/2016   Subjective Data: se admission H and P  Continued Clinical Symptoms:  Alcohol Use Disorder Identification Test Final Score (AUDIT): 12 The "Alcohol Use Disorders Identification Test", Guidelines for Use in Primary Care, Second Edition.  World Pharmacologist [facility]). Score between 0-7:  no or low risk or alcohol related problems. Score between 8-15:  moderate risk of alcohol related problems. Score between 16-19:  high risk of alcohol related problems. Score 20 or above:  warrants further diagnostic evaluation for alcohol dependence and treatment.   CLINICAL FACTORS:   Depression:   Comorbid alcohol abuse/dependence Alcohol/Substance Abuse/Dependencies  Psychiatric Specialty Exam: ROS  Blood pressure 127/79, pulse 62, temperature 97.8 F (36.6 C), temperature source Oral, resp. rate 18, height 5\' 5"  (1.651 m), weight 69.4 kg (153 lb), SpO2 99 %.Body mass index is 25.46 kg/(m^2).  COGNITIVE FEATURES THAT CONTRIBUTE TO RISK:  Closed-mindedness, Polarized thinking and Thought constriction (tunnel vision)    SUICIDE RISK:   Moderate:  Frequent suicidal ideation with limited intensity, and duration, some specificity in terms of plans, no associated intent, good self-control, limited dysphoria/symptomatology, some risk factors present, and identifiable protective factors, including available and accessible social support.  PLAN OF CARE: see admission H and P  I certify that inpatient services furnished can reasonably be  expected to improve the patient's condition.   Nicholaus Bloom, MD 04/15/2016, 6:02 PM

## 2016-04-15 NOTE — BHH Group Notes (Signed)
[place] Group Notes:  (Nursing/MHT/Case Management/Adjunct)  Date:  04/15/2016  Time:  1:21 PM  Type of Therapy:  Psychoeducational Skills  Participation Level:  Active  Participation Quality:  Appropriate  Affect:  Anxious  Cognitive:  Appropriate  Insight:  Lacking  Engagement in Group:  Engaged  Modes of Intervention:  Activity, Discussion, Education and Support  Summary of Progress/Problems: Christy Russo came into group late due to being with the MD. She reports her three supports are family, friends, and Jesus. She states, "I've learned you can't make everyone else happy."   Riley Kill 04/15/2016, 1:21 PM

## 2016-04-15 NOTE — Progress Notes (Signed)
D) Pt has been attending the program and interacting with her peers. Pt has made numerous statements that she is blaming others for the reason she is here and is not accepting responsibility for her actions. Talking about her daughter who will not allow her to see her granddaughter. Pt rates her depression at an 8, hopelessness at a 2 and her anxiety at a 10. Denies SI and HI. A) given support, reassurance and praise. Gently confronted that she should look at how she might have contributed to the situation that she finds herself in now and that the only person she has control over is herself. Tolerated the 1:1. R) Pt denies SI and HI.
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
## 2016-04-15 NOTE — BHH Group Notes (Signed)
[place] [facility] Group Notes:  (Nursing/MHT/Case Management/Adjunct)  Date:  04/15/2016  Time:  4:19 PM  Type of Therapy:  Life Skills Group  Participation Level:  Minimal  Participation Quality:  Appropriate  Affect:  Depressed  Cognitive:  Appropriate  Insight:  Lacking  Engagement in Group:  Engaged  Modes of Intervention:  Activity, Discussion and Education  Summary of Progress/Problems: Tavaria came into group late, but was engaged after she arrived.  Gaylan Gerold E 04/15/2016, 4:19 PM
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
## 2016-04-15 NOTE — BHH Group Notes (Signed)
  10/08/2015     10:00-11:00AM  Summary of Progress/Problems:   In today's process group a decisional balance exercise was used to explore in depth the perceived benefits and costs of alcohol and drugs, as well as the  benefits and costs of replacing these with healthy coping skills.   Motivational Interviewing and the whiteboard were utilized for the exercises.  The patient participated fully in the discussion, adding personal feelings, problems, reasons to the group list.  She was confused at times, did not understand some things that were said, but was open about this and asked the group for explanations which they readily provided.  Type of Therapy:  Group Therapy - Process   Participation Level:  Active  Participation Quality:  Attentive and Sharing  Affect:  Blunted and Depressed  Cognitive:  Confused  Insight:  Improving  Engagement in Therapy:  Engaged  Modes of Intervention:  Education, Motivational Interviewing  Selmer Dominion, [place] 04/15/2016, 12:22 PM

## 2016-04-16 ENCOUNTER — Encounter: Payer: BLUE CROSS/BLUE SHIELD | Admitting: *Deleted

## 2016-04-16 MED ORDER — MAGNESIUM CITRATE PO SOLN: 0.5000 | Freq: Once | ORAL | Status: DC

## 2016-04-16 NOTE — Progress Notes (Addendum)
Patient did not attend the evening speaker AA meeting. Pt was notified that group was beginning but remained in bed.   

## 2016-04-16 NOTE — Progress Notes (Signed)
Recreation Therapy Notes  Date: 05.08.2017 Time: 9:30am Location: [address] Group Room   Group Topic: Stress Management  Goal Area(s) Addresses:  Patient will actively participate in stress management techniques presented during session.   Behavioral Response: Did not attend.   Laureen Ochs Skyleigh Windle, LRT/CTRS        Nevena Rozenberg L 04/16/2016 11:46 AM

## 2016-04-16 NOTE — BHH Group Notes (Signed)
[facility] LCSW Aftercare Discharge Planning Group Note   04/16/2016 11:43 AM  Participation Quality:  DID NOT ATTEND. Invited. Pt chose to rest in bed.   Smart, Christy Mcgraw LCSW
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
## 2016-04-16 NOTE — Plan of Care (Signed)
Problem: Alteration in mood Goal: LTG-Patient reports reduction in suicidal thoughts (Patient reports reduction in suicidal thoughts and is able to verbalize a safety plan for whenever patient is feeling suicidal)  Outcome: Progressing Patient verbally denies suicidal thoughts today. She reports 5/10 for depression on her self inventory and 0/10 for hopelessness.
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
## 2016-04-16 NOTE — Progress Notes (Signed)
D: Pt was isolative to her room. Pt only came out to receive her qhs medications. Pt presented anxious in affect and mood. Pt was minimal in interaction.  A: Writer administered prn medications to pt, per MD orders. Continued support and availability as needed was extended to this pt. Staff continues to monitor pt with q48min checks.  R: No adverse drug reactions noted. Pt receptive to treatment. Pt remains safe at this time.

## 2016-04-16 NOTE — Progress Notes (Signed)
Adult Psychoeducational Group Note  Date:  04/16/2016 Time:  8:30 PM  Group Topic/Focus:  Wrap-Up Group:   The focus of this group is to help patients review their daily goal of treatment and discuss progress on daily workbooks.  Participation Level:  Active  Participation Quality:  Appropriate  Affect:  Appropriate  Cognitive:  Appropriate  Insight: Appropriate  Engagement in Group:  Engaged  Modes of Intervention:  Discussion  Additional Comments:  Pt rated her overall day a 5 out of 10 because she did not achieve her goal for the day, which was to speak to her daughter.   Lincoln Brigham 04/16/2016, 8:56 PM

## 2016-04-16 NOTE — Progress Notes (Signed)
D) Patient presents in wheel chair, due to the pin in her right foot. She reports 5/10 pain in neck and shoulders. She states that the pillows are causing the pain and that she has an order for a therapeutic pillow. Reports no BM, "for 2 or three days". She denies SI/HI/AVH. On her self inventory she reports 5/10 for depression and anxiety and 0/10 for anxiety. A) order for pillow verified and patient called her daughter to have her bring it. Milk of magnesia given for C/O constipation. Reports having a small BM this PM. Pain med was given at 3:13pm for neck pain of 5/10 and was helpful, bringing pain to 2/10. R). Continue to provide safety on the unit.

## 2016-04-16 NOTE — Progress Notes (Signed)
[facility] MD Progress Note  04/16/2016 11:25 AM Christy Russo  MRN:  [id number]  Subjective: Christy Russo says she is doing much better today. She says she wants to go home. Says her depression was related to her daughter preventing her from seeing her 51 year old grand-daughter. She says she feels now that her action to try to overdose was selfish. She states she is never going to do that again. Christy Russo is wearing a brace to her left foot. Says she missed her follow-up appointment for her foot today. Will try to re-schedule this appointment after lunch today. She currently denies any new issues. No suicidal or homicidal ideations. Denies any hallucinations.  Principal Problem: Depression, major, recurrent, moderate ([place])  Diagnosis:   Patient Active Problem List   Diagnosis Date Noted  . Depression, major, recurrent, moderate ([place]) [F33.1] 04/15/2016  . GAD (generalized anxiety disorder) [F41.1] 04/15/2016  . Benzodiazepine dependence, continuous ([place]) [F13.20] 04/15/2016  . Alcohol use disorder, moderate, dependence ([place]) [F10.20] 04/14/2016  . Hammertoe [M20.40] 03/06/2016   Total Time spent with patient: 15 minutes  Past Psychiatric History: Major depressive disorder, Alcoholism  Past Medical History:  Past Medical History  Diagnosis Date  . Kidney stones     Past Surgical History  Procedure Laterality Date  . Abdominal hysterectomy    . C-section x 2     Family History: History reviewed. No pertinent family history.  Family Psychiatric  History: See H&P  Social History:  History  Alcohol Use  . Yes    Comment: Occasionally     History  Drug Use Not on file    Social History   Social History  . Marital Status: Single    Spouse Name: N/A  . Number of Children: N/A  . Years of Education: N/A   Social History Main Topics  . Smoking status: Never Smoker   . Smokeless tobacco: Never Used  . Alcohol Use: Yes     Comment: Occasionally  . Drug Use: None  . Sexual  Activity: Not Asked   Other Topics Concern  . None   Social History Narrative   Additional Social History:  Pain Medications: n/a Prescriptions: n/a History of alcohol / drug use?: Yes Longest period of sobriety (when/how long): unknown Negative Consequences of Use: Financial, Legal, Personal relationships Withdrawal Symptoms: Anorexia Name of Substance 1: alcohol 1 - Age of First Use: 50yo 1 - Amount (size/oz): 2-3 bottles of wine 1 - Frequency: Weekly 1 - Duration: Ongoing 1 - Last Use / Amount: Last night (1 bottle of wine) Name of Substance 2: Benzodiazepines 2 - Age of First Use: 40's 2 - Amount (size/oz): Varies (1 mg tabs) 2 - Frequency: Daily 2 - Duration: Past 4-5 years 2 - Last Use / Amount: Last night (40-50 Xanax 1mg  tabs)  Sleep: Good  Appetite:  Fair  Current Medications: Current Facility-Administered Medications  Medication Dose Route Frequency Provider Last Rate Last Dose  . acetaminophen (TYLENOL) tablet 650 mg  650 mg Oral Q6H PRN Niel Hummer, NP      . alum & mag hydroxide-simeth (MAALOX/MYLANTA) 200-200-20 MG/5ML suspension 30 mL  30 mL Oral Q4H PRN Niel Hummer, NP      . DULoxetine (CYMBALTA) DR capsule 60 mg  60 mg Oral Daily Nicholaus Bloom, MD   60 mg at 04/16/16 [id number]  . hydrOXYzine (ATARAX/VISTARIL) tablet 25 mg  25 mg Oral Q6H PRN Niel Hummer, NP   25 mg at 04/15/16 2122  .  ibuprofen (ADVIL,MOTRIN) tablet 600 mg  600 mg Oral Q6H PRN Dara Hoyer, PA-C   600 mg at 04/16/16 0630  . loperamide (IMODIUM) capsule 2-4 mg  2-4 mg Oral PRN Niel Hummer, NP      . LORazepam (ATIVAN) tablet 1 mg  1 mg Oral Q6H PRN Niel Hummer, NP      . LORazepam (ATIVAN) tablet 1 mg  1 mg Oral TID Niel Hummer, NP   1 mg at 04/16/16 0815   Followed by  . LORazepam (ATIVAN) tablet 1 mg  1 mg Oral BID Niel Hummer, NP       Followed by  . [START ON 04/18/2016] LORazepam (ATIVAN) tablet 1 mg  1 mg Oral Daily Niel Hummer, NP      . magnesium hydroxide (MILK OF  MAGNESIA) suspension 30 mL  30 mL Oral Daily PRN Niel Hummer, NP   30 mL at 04/16/16 0820  . multivitamin with minerals tablet 1 tablet  1 tablet Oral Daily Niel Hummer, NP   1 tablet at 04/16/16 0810  . ondansetron (ZOFRAN-ODT) disintegrating tablet 4 mg  4 mg Oral Q6H PRN Niel Hummer, NP      . thiamine (VITAMIN B-1) tablet 100 mg  100 mg Oral Daily Niel Hummer, NP   100 mg at 04/16/16 0810  . traZODone (DESYREL) tablet 50 mg  50 mg Oral QHS PRN Niel Hummer, NP   50 mg at 04/15/16 2122    Lab Results: No results found for this or any previous visit (from the past 48 hour(s)).  Blood Alcohol level:  Lab Results  Component Value Date   ETH 84* 04/13/2016   Physical Findings: AIMS: Facial and Oral Movements Muscles of Facial Expression: None, normal Lips and Perioral Area: None, normal Jaw: None, normal Tongue: None, normal,Extremity Movements Upper (arms, wrists, hands, fingers): None, normal Lower (legs, knees, ankles, toes): None, normal, Trunk Movements Neck, shoulders, hips: None, normal, Overall Severity Severity of abnormal movements (highest score from questions above): None, normal Incapacitation due to abnormal movements: None, normal Patient's awareness of abnormal movements (rate only patient's report): No Awareness, Dental Status Current problems with teeth and/or dentures?: No Does patient usually wear dentures?: No  CIWA:  CIWA-Ar Total: 1 COWS:  COWS Total Score: 2  Musculoskeletal: Strength & Muscle Tone: within normal limits Gait & Station: normal Patient leans: N/A  Psychiatric Specialty Exam: Review of Systems  Constitutional: Negative.   HENT: Negative.   Eyes: Negative.   Respiratory: Negative.   Cardiovascular: Negative.   Gastrointestinal: Negative.   Genitourinary: Negative.   Musculoskeletal: Negative.   Skin: Negative.   Neurological: Negative.   Endo/Heme/Allergies: Negative.   Psychiatric/Behavioral: Positive for depression  ("Improving") and substance abuse (Alcohol/benzodiazepine dependence). Negative for suicidal ideas, hallucinations and memory loss. The patient has insomnia ("Improving"). The patient is not nervous/anxious.     Blood pressure 126/74, pulse 51, temperature 97.8 F (36.6 C), temperature source Oral, resp. rate 20, height 5\' 5"  (1.651 m), weight 69.4 kg (153 lb), SpO2 99 %.Body mass index is 25.46 kg/(m^2).  General Appearance: Fairly Groomed  Engineer, water:: Fair  Speech: Clear and Coherent  Volume: Decreased  Mood: Anxious and Depressed  Affect: Restricted  Thought Process: Coherent and Goal Directed  Orientation: Full (Time, Place, and Person)  Thought Content: symptoms events worries concerns  Suicidal Thoughts: Yes. without intent/plan  Homicidal Thoughts: No  Memory: Immediate; Fair Recent; Fair Remote; Fair  Judgement: Fair  Insight: Present and Shallow  Psychomotor Activity: Decreased  Concentration: Fair  Recall: [place]: Fair  Akathisia: No  Handed: Right  AIMS (if indicated):    Assets: Desire for Improvement Housing  ADL's: Intact  Cognition: WNL  Sleep: Number of Hours: 6.75         Treatment Plan Summary: Daily contact with patient to assess and evaluate symptoms and progress in treatment and Medication management;1. Continue crisis management, mood stabilization & relapse prevention.. 2. Continue current medication management to reduce current symptoms to base-line and improve the  patient's overall level of functioning; Continue Ativan detox protocols, Duloxetine DR 60 mg for depression & Trazodone 50 mg for insomnia. 3. Treat health problems as indicated. 4. Develop treatment plan to enhance medication adeherance upon discharge & prevent the need for  readmission. 5. Psycho-social education regarding relapse prevention & self care. 6. Will continue PRN treatment regimen per  protocols.  Encarnacion Slates, NP, PMHNP, FNP-BC 04/16/2016, 11:25 AM Agree with NP progress note as above

## 2016-04-17 MED ORDER — TRAZODONE HCL 50 MG PO TABS: 50.0000 mg | ORAL_TABLET | Freq: Every evening | ORAL | Status: DC | PRN

## 2016-04-17 MED ORDER — HYDROXYZINE HCL 25 MG PO TABS: ORAL_TABLET | ORAL | Status: DC

## 2016-04-17 MED ORDER — DULOXETINE HCL 60 MG PO CPEP: 60.0000 mg | ORAL_CAPSULE | Freq: Every day | ORAL | Status: DC

## 2016-04-17 NOTE — Progress Notes (Signed)
Pt attended spiritual care group on grief and loss facilitated by chaplain Tascha Casares   Group opened with brief discussion and psycho-social ed around grief and loss in relationships and in relation to self - identifying life patterns, circumstances, changes that cause losses. Established group norm of speaking from own life experience. Group goal of establishing open and affirming space for members to share loss and experience with grief, normalize grief experience and provide psycho social education and grief support.     

## 2016-04-17 NOTE — BHH Suicide Risk Assessment (Signed)
[facility] Discharge Suicide Risk Assessment   Principal Problem: Depression, major, recurrent, moderate ([place]) Discharge Diagnoses:  Patient Active Problem List   Diagnosis Date Noted  . Depression, major, recurrent, moderate ([place]) [F33.1] 04/15/2016  . GAD (generalized anxiety disorder) [F41.1] 04/15/2016  . Benzodiazepine dependence, continuous ([place]) [F13.20] 04/15/2016  . Alcohol use disorder, moderate, dependence ([place]) [F10.20] 04/14/2016  . Hammertoe [M20.40] 03/06/2016    Total Time spent with patient: 20 minutes  Musculoskeletal: Strength & Muscle Tone: within normal limits Gait & Station: normal Patient leans: normal  Psychiatric Specialty Exam: Review of Systems  Constitutional: Negative.   HENT: Negative.   Eyes: Negative.   Respiratory: Negative.   Cardiovascular: Negative.   Gastrointestinal: Negative.   Genitourinary: Negative.   Musculoskeletal: Positive for joint pain.       S/P foot surgery  Skin: Negative.   Neurological: Negative.   Endo/Heme/Allergies: Negative.   Psychiatric/Behavioral: Positive for substance abuse. The patient is nervous/anxious.     Blood pressure 141/74, pulse 64, temperature 98.2 F (36.8 C), temperature source Oral, resp. rate 16, height 5\' 5"  (1.651 m), weight 69.4 kg (153 lb), SpO2 99 %.Body mass index is 25.46 kg/(m^2).  General Appearance: Fairly Groomed  Engineer, water::  Fair  Speech:  Clear and [id number]  Volume:  Normal  Mood:  Euthymic  Affect:  Appropriate  Thought Process:  Coherent and Goal Directed  Orientation:  Full (Time, Place, and Person)  Thought Content:  plans as he moves   Suicidal Thoughts:  No  Homicidal Thoughts:  No  Memory:  Immediate;   Fair Recent;   Fair Remote;   Fair  Judgement:  Fair  Insight:  Present  Psychomotor Activity:  Normal  Concentration:  Fair  Recall:  [employer] [place]  Language: Fair  Akathisia:  No  Handed:  Right  AIMS (if indicated):     Assets:  Desire for  Improvement Housing Social Support Vocational/Educational  Sleep:  Number of Hours: 6.25  Cognition: WNL  ADL's:  Intact  In full contact with reality. There are no active S/S of withdrawal. There are no active SI plans or intent. She is willing and motivated to pursue outpatient treatment Mental Status Per Nursing Assessment::   On Admission:     Demographic Factors:  Caucasian  Loss Factors: Decline in physical health  Historical Factors: denies  Risk Reduction Factors:   Sense of responsibility to family, Employed and Positive social support  Continued Clinical Symptoms:  Depression:   Insomnia  Cognitive Features That Contribute To Risk:  None    Suicide Risk:  Minimal: No identifiable suicidal ideation.  Patients presenting with no risk factors but with morbid ruminations; may be classified as minimal risk based on the severity of the depressive symptoms  Follow-up Information    Follow up with Patient declined medication management appt and DOES NOT want records sent to her Primary Care Physician .      Follow up with [facility] On 04/25/2016.   Why:  Appt for counseling with [facility] on this date at 2:30PM. Please bring photo ID/insurance card to this appt. Thank you. Please call within 48 hours of appt if you have to cancel or reschedule.    Contact information:   [address] [place], [place] [address] Phone: [phone] Fax: [phone]      Plan Of Care/Follow-up recommendations:  Activity:  as tolerated Diet:  regular Follow up as above Taurus Alamo A, MD 04/17/2016, 12:55 PM

## 2016-04-17 NOTE — BHH Group Notes (Signed)
[facility] Group Notes:  (Nursing/MHT/Case Management/Adjunct)  Date:  04/17/2016  Time:  0900 am  Type of Therapy:  Psychoeducational Skills  Participation Level:  Did Not Attend  Patient invited; declined to attend.  Christy Russo 04/17/2016, 9:06 AM

## 2016-04-17 NOTE — Progress Notes (Signed)
D: Pt presents anxious in affect and mood. Pt continues to discuss the ongoing conflict she has with her daughter. Per pt, she is not open to discussing their issues. Pt is noted to be visible and active within the milieu. Pt denies any current SI.  A: Writer administered scheduled and prn medications to pt, per MD orders. Continued support and availability as needed was extended to this pt. Staff continues to monitor pt with q76min checks.  R: No adverse drug reactions noted. Pt receptive to treatment. Pt remains safe at this time.

## 2016-04-17 NOTE — Progress Notes (Signed)
Patient discharged per physician order; patient denies SI/HI and A/V hallucinations; reviewed AVS, SRA, and transaction record reviewed and given to the patient, patient had no other questions or concerns at this time; patient left the unit ambulatory with the boot on her left foot due to the hammertoe; patient verbalized and signed that she received all of her belongings

## 2016-04-17 NOTE — Progress Notes (Signed)
  [facility] Adult Case Management Discharge Plan :  Will you be returning to the same living situation after discharge:  Yes,  home At discharge, do you have transportation home?: Yes,  family member or friend Do you have the ability to pay for your medications: Yes,  [place] private insurance  Release of information consent forms completed and submitted to medical records by CSW.  Patient to Follow up at: Follow-up Information    Follow up with Patient declined medication management appt and DOES NOT want records sent to her Primary Care Physician .      Follow up with [facility] On 04/25/2016.   Why:  Appt for counseling with [facility] on this date at 2:30PM. Please bring photo ID/insurance card to this appt. Thank you. Please call within 48 hours of appt if you have to cancel or reschedule.    Contact information:   [address] [place], [place] [address] Phone: [phone] Fax: [phone]      Next level of care provider has access to [place] and Suicide Prevention discussed: Yes,  SPE completed with pt; contact attempts made with pt's daughter. Pt provided with Mobile Crisis information and SPI pamphlet. She was encouraged to share information with her support network, ask questions, and talk about any concerns.   Have you used any form of tobacco in the last 30 days? (Cigarettes, Smokeless Tobacco, Cigars, and/or Pipes): Yes  Has patient been referred to the Quitline?: Patient refused referral  Patient has been referred for addiction treatment: Yes  Smart, Tyshon Fanning LCSW 04/17/2016, 11:40 AM

## 2016-04-17 NOTE — [facility] Suicide Risk Assessment (Signed)
[facility] INPATIENT:  Family/Significant Other Suicide Prevention Education  Suicide Prevention Education:  Contact Attempts: Addalyne Ayotte (pt's daughter) [phone] has been identified by the patient as the family member/significant other with whom the patient will be residing, and identified as the person(s) who will aid the patient in the event of a mental health crisis.  With written consent from the patient, two attempts were made to provide suicide prevention education, prior to and/or following the patient's discharge.  We were unsuccessful in providing suicide prevention education.  A suicide education pamphlet was given to the patient to share with family/significant other.  Date and time of first attempt: 04/16/16 at 4:15PM Date and time of second attempt: 04/17/16 at 11:38AM (voicemail left requesting call back at her earliest convenience).   Smart, Jen Benedict LCSW 04/17/2016, 11:38 AM
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
## 2016-04-20 ENCOUNTER — Ambulatory Visit (INDEPENDENT_AMBULATORY_CARE_PROVIDER_SITE_OTHER): Payer: BLUE CROSS/BLUE SHIELD | Admitting: Podiatry

## 2016-04-20 ENCOUNTER — Encounter: Payer: Self-pay | Admitting: Podiatry

## 2016-04-20 ENCOUNTER — Ambulatory Visit (INDEPENDENT_AMBULATORY_CARE_PROVIDER_SITE_OTHER): Payer: BLUE CROSS/BLUE SHIELD

## 2016-04-20 VITALS — BP 123/88 | HR 77 | Resp 18

## 2016-04-20 DIAGNOSIS — M2042 Other hammer toe(s) (acquired), left foot: Secondary | ICD-10-CM | POA: Diagnosis not present

## 2016-04-20 DIAGNOSIS — Z9889 Other specified postprocedural states: Secondary | ICD-10-CM

## 2016-04-24 ENCOUNTER — Telehealth: Payer: Self-pay | Admitting: Podiatry

## 2016-04-24 ENCOUNTER — Other Ambulatory Visit: Payer: Self-pay

## 2016-04-24 DIAGNOSIS — Z9889 Other specified postprocedural states: Secondary | ICD-10-CM | POA: Insufficient documentation

## 2016-04-24 MED ORDER — NONFORMULARY OR COMPOUNDED ITEM: 1.0000 g | Freq: Three times a day (TID) | Status: DC

## 2016-04-24 NOTE — Progress Notes (Signed)
This encounter was created in error - please disregard.

## 2016-04-24 NOTE — Telephone Encounter (Signed)
Pt called and asked for you to please call her back.

## 2016-04-24 NOTE — Progress Notes (Signed)
Patient ID: Christy Russo, female   DOB: 04/09/1966, 50 y.o.   MRN: [id number]  Subjective: Christy Russo is a 50 y.o. is seen today in office s/p left 3rd DIPJ arthroplasty. She states overall she is doing well. She presents today for pain removal. She is not taking pain medicine. She is continued surgical shoe. Denies any systemic complaints such as fevers, chills, nausea, vomiting. No calf pain, chest pain, shortness of breath.   Objective: General: No acute distress, AAOx3  DP/PT pulses palpable 2/4, CRT < 3 sec to all digits.  Protective sensation intact. Motor function intact.  Left foot: Incision is well coapted without any evidence of dehiscence. And a scar has formed. K wires intact the third toe there any drainage or redness. There is no surrounding erythema, ascending cellulitis, fluctuance, crepitus, malodor, drainage/purulence. There is minimal edema around the surgical site. There is no signficant pain along the surgical site.  No other areas of tenderness to bilateral lower extremities.  No other open lesions or pre-ulcerative lesions.  No pain with calf compression, swelling, warmth, erythema.   Assessment and Plan:  Status post left foot surgery, doing well with no complications   -Treatment options discussed including all alternatives, risks, and complications -X-rays were obtained and reviewed with the patient. K wire intact. No evidence of acute fracture. -Patient was removed today in total. Antibiotic was applied followed by dressing. She inserted transition to regular shoe as tolerated. -Ice/elevation -Pain medication as needed. -Monitor for any clinical signs or symptoms of infection and DVT/PE and directed to call the office immediately should any occur or go to the ER. -Follow-up in 4 weeks or sooner if any problems arise. In the meantime, encouraged to call the office with any questions, concerns, change in symptoms.   Celesta Gentile, DPM

## 2016-05-04 ENCOUNTER — Other Ambulatory Visit: Payer: Self-pay | Admitting: Internal Medicine

## 2016-05-04 DIAGNOSIS — Z1231 Encounter for screening mammogram for malignant neoplasm of breast: Secondary | ICD-10-CM

## 2016-05-09 ENCOUNTER — Encounter: Payer: Self-pay | Admitting: *Deleted

## 2016-05-09 ENCOUNTER — Telehealth: Payer: Self-pay | Admitting: *Deleted

## 2016-05-09 NOTE — Telephone Encounter (Signed)
OK to return to [employer] if she can wear a regular shoe and no pain

## 2016-05-09 NOTE — Telephone Encounter (Addendum)
Pt states she would like a release to go back to work, call for the fax number.  Pt states she can work normal schedule duties with a regular shoe.  Letter faxed to Mr. Polly Cobia.

## 2016-05-09 NOTE — Telephone Encounter (Signed)
fine

## 2016-05-16 ENCOUNTER — Ambulatory Visit: Payer: Self-pay

## 2016-05-18 ENCOUNTER — Encounter: Payer: BLUE CROSS/BLUE SHIELD | Admitting: Podiatry

## 2016-05-21 ENCOUNTER — Ambulatory Visit (INDEPENDENT_AMBULATORY_CARE_PROVIDER_SITE_OTHER): Payer: BLUE CROSS/BLUE SHIELD

## 2016-05-21 ENCOUNTER — Encounter: Payer: Self-pay | Admitting: Podiatry

## 2016-05-21 ENCOUNTER — Ambulatory Visit (INDEPENDENT_AMBULATORY_CARE_PROVIDER_SITE_OTHER): Payer: BLUE CROSS/BLUE SHIELD | Admitting: Podiatry

## 2016-05-21 DIAGNOSIS — M2042 Other hammer toe(s) (acquired), left foot: Secondary | ICD-10-CM

## 2016-05-21 DIAGNOSIS — Z9889 Other specified postprocedural states: Secondary | ICD-10-CM

## 2016-05-21 NOTE — Progress Notes (Signed)
Patient ID: Christy Russo, female   DOB: 07-16-1966, 50 y.o.   MRN: [id number]  Subjective: Christy Russo is a 50 y.o. is seen today in office s/p left 3rd DIPJ arthroplasty. She says that overall she is doing better and her pain is improved compared to prior to surgery however she has history toe was shifted. She states that the day after I saw her last she was. Active walking several miles and standing for 12 hour shifts. She has noticed some increase in swelling to the toe but denies any redness or warmth. Denies any systemic complaints such as fevers, chills, nausea, vomiting. No calf pain, chest pain, shortness of breath.   Objective: General: No acute distress, AAOx3  DP/PT pulses palpable 2/4, CRT < 3 sec to all digits.  Protective sensation intact. Motor function intact.  Left foot: Incision is well coapted without any evidence of dehiscence and a scar has formed. There is mild edema to the distal aspect of the toe level of the DIPJ. The toe has laterally drifted distally. There is no erythema or increase in warmth. Mild tenderness the surgical site. No other areas of tenderness. No other open lesions or pre-ulcerative lesions.  No pain with calf compression, swelling, warmth, erythema.   Assessment and Plan:  Status post left foot surgery, with shifting of the DIPJ  -Treatment options discussed including all alternatives, risks, and complications -X-rays were obtained and reviewed with the patient.  Toe was shifted laterally at the level the DIPJ. -Post point was given to help stabilize the toe to see if this will help. I discussed with her, line she's had any problems meantime another surgery however her pain has improved compared to what it was and will continue with conservative treatment for now. Continue to the toe to help with swelling. -Monitor for any clinical signs or symptoms of infection and DVT/PE and directed to call the office immediately should any occur or go to  the ER. -Follow-up as scheduled or sooner if any problems arise. In the meantime, encouraged to call the office with any questions, concerns, change in symptoms.   Celesta Gentile, DPM

## 2016-05-29 ENCOUNTER — Encounter: Payer: Self-pay | Admitting: *Deleted

## 2016-05-29 ENCOUNTER — Telehealth: Payer: Self-pay | Admitting: *Deleted

## 2016-05-29 NOTE — Telephone Encounter (Addendum)
Pt called left name, and phone.  Pt states had surgery on foot 03/21/2016, went back to work 05/12/2016. Pt states the day Dr.Wagoner put the toe brace on it, she slept in it and it hurt so bad she could not work and she hurts so bad today she can't work and wants a note. Pt request note to be faxed to Mr. Illene Regulus [phone].  Dr. Berton Lan.  Note faxed to Mr. Polly Cobia.

## 2016-06-04 ENCOUNTER — Ambulatory Visit (INDEPENDENT_AMBULATORY_CARE_PROVIDER_SITE_OTHER): Payer: BLUE CROSS/BLUE SHIELD | Admitting: Podiatry

## 2016-06-04 ENCOUNTER — Encounter: Payer: Self-pay | Admitting: Podiatry

## 2016-06-04 DIAGNOSIS — M2042 Other hammer toe(s) (acquired), left foot: Secondary | ICD-10-CM

## 2016-06-04 DIAGNOSIS — Z9889 Other specified postprocedural states: Secondary | ICD-10-CM

## 2016-06-04 NOTE — Progress Notes (Signed)
Patient ID: Christy Russo, female   DOB: 1966/04/30, 50 y.o.   MRN: [id number]  Subjective: Christy Russo is a 50 y.o. is seen today in office s/p left 3rd DIPJ arthroplasty. He said that she's been wearing the splint to regain the toe is straighter however it is hurting her foot to wear the splint with shoes. She has remained active and wearing a regular shoe. No recent injury. Denies any systemic complaints such as fevers, chills, nausea, vomiting. No calf pain, chest pain, shortness of breath.   Objective: General: No acute distress, AAOx3  DP/PT pulses palpable 2/4, CRT < 3 sec to all digits.  Protective sensation intact. Motor function intact.  Left foot: Incision is well coapted without any evidence of dehiscence and a scar has formed. There is mild edema to the distal aspect of the toe level of the DIPJ however this is improved. The toe has laterally drifted distally but in a more rectus position. There is no erythema or increase in warmth. Mild tenderness the surgical site. No other areas of tenderness. No other open lesions or pre-ulcerative lesions.  No pain with calf compression, swelling, warmth, erythema.   Assessment and Plan:  Status post left foot surgery, with shifting of the DIPJ  -Treatment options discussed including all alternatives, risks, and complications -Dispensed a different toe splint to help Latoya rectus position. Continue supportive shoe gear. Also discussed with her potential further surgical intervention the future should her symptoms continue can the deviation to have her today does appear to be clinically improved. Continue ice and elevation. Continue take the toes well to help with swelling. At this point follow-up with me if symptoms are not resolved within the next 4-6 weeks or sooner if any issues are to arise.  Celesta Gentile, DPM

## 2016-06-07 ENCOUNTER — Encounter: Payer: Self-pay | Admitting: *Deleted

## 2016-06-11 ENCOUNTER — Other Ambulatory Visit: Payer: BLUE CROSS/BLUE SHIELD | Admitting: Adult Health

## 2016-07-02 ENCOUNTER — Ambulatory Visit (INDEPENDENT_AMBULATORY_CARE_PROVIDER_SITE_OTHER): Payer: BLUE CROSS/BLUE SHIELD | Admitting: Podiatry

## 2016-07-02 ENCOUNTER — Ambulatory Visit (INDEPENDENT_AMBULATORY_CARE_PROVIDER_SITE_OTHER): Payer: BLUE CROSS/BLUE SHIELD

## 2016-07-02 ENCOUNTER — Encounter: Payer: Self-pay | Admitting: Podiatry

## 2016-07-02 DIAGNOSIS — Z09 Encounter for follow-up examination after completed treatment for conditions other than malignant neoplasm: Secondary | ICD-10-CM

## 2016-07-02 DIAGNOSIS — M2042 Other hammer toe(s) (acquired), left foot: Secondary | ICD-10-CM

## 2016-07-02 DIAGNOSIS — M79672 Pain in left foot: Secondary | ICD-10-CM | POA: Diagnosis not present

## 2016-07-02 DIAGNOSIS — M779 Enthesopathy, unspecified: Secondary | ICD-10-CM | POA: Diagnosis not present

## 2016-07-02 MED ORDER — MELOXICAM 15 MG PO TABS: 15.0000 mg | ORAL_TABLET | Freq: Every day | ORAL | 0 refills | Status: DC

## 2016-07-02 NOTE — Patient Instructions (Signed)

## 2016-07-03 ENCOUNTER — Telehealth: Payer: Self-pay | Admitting: *Deleted

## 2016-07-03 MED ORDER — MELOXICAM 15 MG PO TABS: 15.0000 mg | ORAL_TABLET | Freq: Every day | ORAL | 0 refills | Status: DC

## 2016-07-03 NOTE — Telephone Encounter (Addendum)
Pt states her prescription is not at the [facility].  I told pt I would investigate and have the Meloxicam sent to her pharmacy. Left message informing pt the Meloxicam was changed to the [facility]. 07/04/2016-Pt states the medication is not at [facility] in [place], will now have to sent to pharmacy in [place].  I reviewed medication Meloxicam order and it was confirmed received by [facility] on [address] in [place] [phone] at 3:20pm after I spoke with pt on 07/03/2016.  Left message with this information and asked pt to call and confirm if she still wanted the medication there or [facility] in [place]. 07/30/2016-Faxed PT orders to [facility] [phone].

## 2016-07-08 NOTE — Progress Notes (Signed)
Subjective: 50 year old female presents the office today for follow-up of a lesion left third toe surgery. She states that this wound has decreased the pain is also decreased since she is doing better from the surgery. The toe still somewhat not as rectus as it was immediately after surgery but it has improved. She's having no difficulty of the toe at this point. Today she presents for pain and the back of her left heel which is been ongoing for several months. Been seen by Dr., she states that she has had numerous steroid injections the back of her heel without much relief. She denies any other treatment really other than steroid injections. Denies any recent injury or trauma. Majority of his inches lateral walking on her shoes rub on the back of her heel. Denies any systemic complaints such as fevers, chills, nausea, vomiting. No acute changes since last appointment, and no other complaints at this time.   Objective: AAO x3, NAD DP/PT pulses palpable bilaterally, CRT less than 3 seconds Left third toe and a more rectus position with some faint edema to the toe without any erythema or increase in warmth. There is no tenderness palpation. The distal aspect of the toe is slightly deviated however it is more rectus compared to what it was prior. On the posterior aspect left heel is tenderness palpation on the insertion of the Achilles tendon. The Achilles tendon appears to be intact S Thompson test is negative. There is no edema, erythema. Equinus is present. Pain lateral compression of the calcaneus. No other areas of tenderness bilateral lower extremity is. No open lesions or pre-ulcerative lesions.  No pain with calf compression, swelling, warmth, erythema  Assessment: Resolving left third toe pain with slight posterior heel pain  Plan: -All treatment options discussed with the patient including all alternatives, risks, complications.  -Etiology of symptoms were discussed -In regards the toe  continue with taping the toe to help with swelling. If there is any problems with the toe to call the office, discharge her from the surgery fifth toe. -In regards the posterior left heel pain she's had multiple injections I recommended to hold off any further steroid injections at this time. I discussed with her stretching exercises to be done on a daily basis as well as ice as well as anti-inflammatories. I also discussed shoe gear modifications and orthotics. Offloading pads were dispensed. I will go ahead and start with this conservative treatment. If she continues to have pain possible physical therapy. -Follow-up as scheduled or sooner if needed. -Patient encouraged to call the office with any questions, concerns, change in symptoms.   Celesta Gentile, DPM

## 2016-07-30 ENCOUNTER — Encounter: Payer: Self-pay | Admitting: Podiatry

## 2016-07-30 ENCOUNTER — Ambulatory Visit (INDEPENDENT_AMBULATORY_CARE_PROVIDER_SITE_OTHER): Payer: BLUE CROSS/BLUE SHIELD | Admitting: Podiatry

## 2016-07-30 DIAGNOSIS — M779 Enthesopathy, unspecified: Secondary | ICD-10-CM

## 2016-07-30 DIAGNOSIS — M79672 Pain in left foot: Secondary | ICD-10-CM

## 2016-07-30 MED ORDER — MELOXICAM 15 MG PO TABS: 15.0000 mg | ORAL_TABLET | Freq: Every day | ORAL | 0 refills | Status: DC

## 2016-07-31 NOTE — Progress Notes (Signed)
Subjective: 50 year old female presents For Follow-Up Evaluation of Left Posterior Heel Pain Achilles Tendinitis. She States That She Is Still Getting Pain in the Back of Her Heel. She's Been taking anti-inflammatories as well as icing changes shoes without much relief of symptoms. She's had multiple steroid injections the area.  Denies any systemic complaints such as fevers, chills, nausea, vomiting. No acute changes since last appointment, and no other complaints at this time.   Objective: AAO x3, NAD DP/PT pulses palpable bilaterally, CRT less than 3 seconds At this time she is having no pain to the left third toe. There is no erythema or increase in warmth. There is continued tenderness the posterior aspect of the left heel and a prominent retrocalcaneal exostosis as well as along the course of the Achilles tendon proximal to the insertion of the calcaneus. Has Negative. There Is No Overlying Edema, Erythema, Increased Warmth. No Defect Is Noted in the Tendon. No Pain Lateral Compression of the Calcaneus. No Other Areas of Tenderness. No edema, erythema, increase in warmth to bilateral lower extremities.  No open lesions or pre-ulcerative lesions.  No pain with calf compression, swelling, warmth, erythema  Assessment: Left Achilles tendinitis, retrocalcaneal exostosis  Plan: -All treatment options discussed with the patient including all alternatives, risks, complications.  -At this time a discussed with her immobilization in a cam boot. This was dispensed today for her. Also would like to start physical therapy next couple weeks. A prescription for this was provided today. The symptoms continue possible MRI and surgical intervention. Continue ice and elevation as well as anti-inflammatories as needed. -Patient encouraged to call the office with any questions, concerns, change in symptoms.   Celesta Gentile, DPM

## 2016-08-27 ENCOUNTER — Encounter: Payer: Self-pay | Admitting: Podiatry

## 2016-08-27 ENCOUNTER — Ambulatory Visit (INDEPENDENT_AMBULATORY_CARE_PROVIDER_SITE_OTHER): Payer: BLUE CROSS/BLUE SHIELD | Admitting: Podiatry

## 2016-08-27 DIAGNOSIS — M779 Enthesopathy, unspecified: Secondary | ICD-10-CM

## 2016-08-27 DIAGNOSIS — T148 Other injury of unspecified body region: Secondary | ICD-10-CM | POA: Diagnosis not present

## 2016-08-27 DIAGNOSIS — M79672 Pain in left foot: Secondary | ICD-10-CM

## 2016-08-27 DIAGNOSIS — T148XXA Other injury of unspecified body region, initial encounter: Secondary | ICD-10-CM

## 2016-08-27 MED ORDER — TRAMADOL HCL 50 MG PO TABS: 50.0000 mg | ORAL_TABLET | Freq: Two times a day (BID) | ORAL | 0 refills | Status: DC | PRN

## 2016-08-28 ENCOUNTER — Other Ambulatory Visit: Payer: Self-pay

## 2016-08-28 DIAGNOSIS — M779 Enthesopathy, unspecified: Secondary | ICD-10-CM

## 2016-08-28 DIAGNOSIS — Z9889 Other specified postprocedural states: Secondary | ICD-10-CM

## 2016-08-28 DIAGNOSIS — T148XXA Other injury of unspecified body region, initial encounter: Secondary | ICD-10-CM

## 2016-08-28 NOTE — Progress Notes (Signed)
Subjective: 50 year old female presents the office today for follow-up evaluation of left posterior heel pain, Achilles tendinitis. She is that she is still getting quite a bit of pain. She does feel better when she was wearing the cam boot. She has not made significant improvement with physical therapy. She's had multiple steroid injections and other treatments that was performed by another physician before she started seeing myself. She denies any recent injury or trauma. Denies any systemic complaints such as fevers, chills, nausea, vomiting. No acute changes since last appointment, and no other complaints at this time.   Objective: AAO x3, NAD DP/PT pulses palpable bilaterally, CRT less than 3 seconds There is tenderness to palpation on the posterior aspect of the left calcaneus at the insertion of the Achilles tendon and along a prominent retrocalcaneal exostosis. There is no overlying edema, erythema, increase in warmth. Thompson test is negative. No defect noted within the tendon. The left third toe currently without any pain. This is somewhat deviated position. Occasion she does state that she gets a sharp pain to the toe but this is improving. No open lesions or pre-ulcerative lesions.  No pain with calf compression, swelling, warmth, erythema  Assessment: Left retrocalcaneal exostosis, Achilles tendinitis  Plan: -All treatment options discussed with the patient including all alternatives, risks, complications.  -Previous x-rays were reviewed with the patient. There does appear to be some obliteration of Kager's triangle however the tendon clinically pursue be intact. Will order an MRI to rule out tear of the tendon  -Continue cam boot for now. -Tramadol -Follow-up after MRI sooner if needed. -Patient encouraged to call the office with any questions, concerns, change in symptoms.   Celesta Gentile, DPM

## 2016-08-29 ENCOUNTER — Telehealth: Payer: Self-pay | Admitting: Podiatry

## 2016-08-29 NOTE — Telephone Encounter (Signed)
Christy Russo can you call this in. Thanks.

## 2016-08-29 NOTE — Telephone Encounter (Signed)
Patient called saying that when she called [facility] this morning to see if her Tramadol Rx was ready they told her that their fax machine is down and she wants to know if we could call in the Rx to [facility] in [place]. Patient also stated she has not heard anything from [facility] about her MRI appointment date and time. I told her it was just completed yesterday and to give them until tomorrow to hear from them.

## 2016-08-30 ENCOUNTER — Other Ambulatory Visit: Payer: Self-pay

## 2016-08-30 NOTE — Progress Notes (Signed)
Error

## 2016-09-02 ENCOUNTER — Ambulatory Visit
Admission: RE | Admit: 2016-09-02 | Discharge: 2016-09-02 | Disposition: A | Discharge status: Home / Self Care | Payer: BLUE CROSS/BLUE SHIELD | Source: Ambulatory Visit | Attending: Podiatry | Admitting: Podiatry

## 2016-09-02 DIAGNOSIS — T148XXA Other injury of unspecified body region, initial encounter: Secondary | ICD-10-CM

## 2016-09-02 DIAGNOSIS — Z9889 Other specified postprocedural states: Secondary | ICD-10-CM

## 2016-09-02 DIAGNOSIS — M779 Enthesopathy, unspecified: Secondary | ICD-10-CM

## 2016-09-04 ENCOUNTER — Telehealth: Payer: Self-pay | Admitting: *Deleted

## 2016-09-04 ENCOUNTER — Encounter: Payer: Self-pay | Admitting: Podiatry

## 2016-09-04 NOTE — Telephone Encounter (Signed)
"  If you would give me a call back at your convenience, I'd appreciate it.  Thank you."

## 2016-09-10 ENCOUNTER — Ambulatory Visit (INDEPENDENT_AMBULATORY_CARE_PROVIDER_SITE_OTHER): Payer: BLUE CROSS/BLUE SHIELD | Admitting: Podiatry

## 2016-09-10 ENCOUNTER — Encounter: Payer: Self-pay | Admitting: Podiatry

## 2016-09-10 DIAGNOSIS — M779 Enthesopathy, unspecified: Secondary | ICD-10-CM

## 2016-09-10 DIAGNOSIS — M7731 Calcaneal spur, right foot: Secondary | ICD-10-CM

## 2016-09-10 NOTE — Patient Instructions (Signed)

## 2016-09-20 NOTE — Progress Notes (Signed)
Subjective: 50 year old female presents the office today for follow-up evaluation of left posterior heel pain, Achilles tendinitis. She presents the office also for MRI results. She states that she is still getting pain in the back of her heel. She wears the cam boot intermittently. She's having heel pain on the back of her heel for quite some time. She previously had a heel lift, multiple injections, custom inserts not provide complete relief. She's had no other treatment for this. She denies any recent injury or trauma. Denies any systemic complaints such as fevers, chills, nausea, vomiting. No acute changes since last appointment, and no other complaints at this time.   Objective: AAO x3, NAD DP/PT pulses palpable bilaterally, CRT less than 3 seconds There is continued tenderness to palpation on the posterior aspect of the left calcaneus at the insertion of the Achilles tendon and along a prominent retrocalcaneal exostosis. There is no overlying edema, erythema, increase in warmth. Thompson test is negative. No defect noted within the tendon. The left third toe currently without any pain. This is somewhat deviated position. Occasion she does state that she gets a sharp pain to the toe but this is improving. No open lesions or pre-ulcerative lesions.  No pain with calf compression, swelling, warmth, erythema  Assessment: Left retrocalcaneal exostosis, Achilles tendinitis  Plan: -All treatment options discussed with the patient including all alternatives, risks, complications.  -MRI results were discussed the patient. - Both conservative and surgical treatment options were discussed the patient today. -At this time we'll continue conservative treatment as she cannot have surgery this time due to work. Night splint was dispensed today. Gel offloading pad was also dispensed. Continue with stretching, icing exercises as well as supportive shoe gear. Recommend her to bring her orthotic with her to the  next appointment second evaluated. She declined the steroid injections.  -Follow-up as scheduled or sooner if needed. Call any questions concerns in the meantime.  Celesta Gentile, DPM

## 2016-10-22 ENCOUNTER — Ambulatory Visit (INDEPENDENT_AMBULATORY_CARE_PROVIDER_SITE_OTHER): Payer: BLUE CROSS/BLUE SHIELD | Admitting: Podiatry

## 2016-10-22 DIAGNOSIS — M779 Enthesopathy, unspecified: Secondary | ICD-10-CM

## 2016-10-22 DIAGNOSIS — M7731 Calcaneal spur, right foot: Secondary | ICD-10-CM | POA: Diagnosis not present

## 2016-10-22 MED ORDER — NAPROXEN 500 MG PO TABS: 500.0000 mg | ORAL_TABLET | Freq: Two times a day (BID) | ORAL | 0 refills | Status: DC

## 2016-10-23 NOTE — Progress Notes (Signed)
Subjective: 50 year old female presents the office today for follow-up evaluation of left posterior heel pain, Achilles tendinitis. She states that she is still having pain. She's continue stretching, icing as well as changing shoes, offloading pads, anti-inflammatories or any significant resolution of symptoms. She will to pursue a surgical intervention however this will have to await until next year. She presents to discuss other treatment options. Denies any systemic complaints such as fevers, chills, nausea, vomiting. No acute changes since last appointment, and no other complaints at this time.   Objective: AAO x3, NAD DP/PT pulses palpable bilaterally, CRT less than 3 seconds There is continued tenderness to palpation on the posterior aspect of the left calcaneus at the insertion of the Achilles tendon and along a prominent retrocalcaneal exostosis. There is no overlying edema, erythema, increase in warmth. Thompson test is negative. No defect noted within the tendon. The left third toe currently without any pain. Minimal swelling Overall exam is unchanged. No open lesions or pre-ulcerative lesions.  No pain with calf compression, swelling, warmth, erythema  Assessment: Left retrocalcaneal exostosis, Achilles tendinitis  Plan: -All treatment options discussed with the patient including all alternatives, risks, complications. -At this point discussed further treatment options with the patient including physical therapy, EPAT. She is hesitant on either of these due to price. We will go ahead and start EPAT as a trial to see how she responds. Discussed this is not a guarantee of resolution of symptoms and she understands this We will see her back next week for the first treatment.    Celesta Gentile, DPM

## 2016-10-31 ENCOUNTER — Ambulatory Visit (INDEPENDENT_AMBULATORY_CARE_PROVIDER_SITE_OTHER): Payer: BLUE CROSS/BLUE SHIELD | Admitting: Podiatry

## 2016-10-31 DIAGNOSIS — M7731 Calcaneal spur, right foot: Secondary | ICD-10-CM

## 2016-10-31 DIAGNOSIS — M779 Enthesopathy, unspecified: Secondary | ICD-10-CM

## 2016-10-31 NOTE — Progress Notes (Signed)
   Subjective:    Patient ID: Christy Russo, female    DOB: 1966-04-25, 50 y.o.   MRN: [id number]  HPI  Pt presents stating she has pain in the posterior aspect of her left heel that has been ongoing for several months.   Review of Systems    All other systems negative Objective:   Physical Exam Pain and tenderness on palpation of Lt posterior heel, with not swelling in area       Assessment & Plan:  ESWT administered to Lt heel at 7 joules for 3000 pulses. Pt tolerated well, with pain reaching a 7 of 10 in certain places on the heel. EPAT delivered to surrounding achilles tendon for 3000 pulses. Advised against usage of NSAIDS and ice, advised on boot usage and utilization of night splint. Re-appointed in 1 week for 2nd treatment

## 2016-11-05 ENCOUNTER — Ambulatory Visit (INDEPENDENT_AMBULATORY_CARE_PROVIDER_SITE_OTHER): Payer: BLUE CROSS/BLUE SHIELD | Admitting: Podiatry

## 2016-11-05 DIAGNOSIS — M7731 Calcaneal spur, right foot: Secondary | ICD-10-CM

## 2016-11-05 DIAGNOSIS — M779 Enthesopathy, unspecified: Secondary | ICD-10-CM

## 2016-11-08 NOTE — Progress Notes (Signed)
   Subjective:    Patient ID: Christy Russo, female    DOB: [birth year], 50 y.o.   MRN: [id number]  HPI  Pt presents stating she has pain in the posterior aspect of her left heel that has been ongoing for several months and she presents today for her 2nd EPAT treatment. No complications after the last treatment. No new complaints today.   Review of Systems    All other systems negative Objective:   Physical Exam Pain and tenderness on palpation of Lt posterior heel, with no swelling in area       Assessment & Plan:  ESWT administered to Lt heel at 15 joules for 3000 pulses. Patient started the procedure well any complications. Advised against usage of NSAIDS and ice, advised on boot usage and utilization of night splint. Re-appointed in 1 week for her third treatment.

## 2016-11-15 ENCOUNTER — Ambulatory Visit (INDEPENDENT_AMBULATORY_CARE_PROVIDER_SITE_OTHER): Payer: BLUE CROSS/BLUE SHIELD | Admitting: Podiatry

## 2016-11-15 DIAGNOSIS — M779 Enthesopathy, unspecified: Secondary | ICD-10-CM

## 2016-11-15 NOTE — Progress Notes (Signed)
   Subjective:    Patient ID: Christy Russo, female    DOB: March 28, 1966, 50 y.o.   MRN: [id number]  HPI  Pt presents stating she has pain in the posterior aspect of her left heel that has been ongoing for several months.   Review of Systems    All other systems negative Objective:   Physical Exam  Mild tenderness on palpation Lt posterior heel       Assessment & Plan:  ESWT administered to Lt heel at 17 joules for 3000 pulses. Pt tolerated well, with pain reaching a 7 of 10 in certain places on the heel. EPAT delivered to surrounding achilles tendon for 3000 pulses. Advised against usage of NSAIDS and ice, advised on boot usage and utilization of night splint. Re-appointed in 4 weeks for 4th treatment

## 2016-12-10 HISTORY — PX: NECK SURGERY: SHX720

## 2016-12-13 ENCOUNTER — Ambulatory Visit (INDEPENDENT_AMBULATORY_CARE_PROVIDER_SITE_OTHER): Payer: Self-pay

## 2016-12-13 DIAGNOSIS — M779 Enthesopathy, unspecified: Secondary | ICD-10-CM

## 2017-01-02 ENCOUNTER — Other Ambulatory Visit (HOSPITAL_COMMUNITY): Payer: Self-pay | Admitting: Internal Medicine

## 2017-01-02 DIAGNOSIS — M5412 Radiculopathy, cervical region: Secondary | ICD-10-CM

## 2017-01-08 ENCOUNTER — Ambulatory Visit (HOSPITAL_COMMUNITY)
Admission: RE | Admit: 2017-01-08 | Discharge: 2017-01-08 | Disposition: A | Discharge status: Home / Self Care | Payer: BLUE CROSS/BLUE SHIELD | Source: Ambulatory Visit | Attending: Internal Medicine | Admitting: Internal Medicine

## 2017-01-08 DIAGNOSIS — M8938 Hypertrophy of bone, other site: Secondary | ICD-10-CM | POA: Insufficient documentation

## 2017-01-08 DIAGNOSIS — M5412 Radiculopathy, cervical region: Secondary | ICD-10-CM | POA: Diagnosis present

## 2017-01-09 ENCOUNTER — Encounter: Payer: Self-pay | Admitting: *Deleted

## 2017-01-10 ENCOUNTER — Encounter: Payer: Self-pay | Admitting: Adult Health

## 2017-01-10 ENCOUNTER — Ambulatory Visit (INDEPENDENT_AMBULATORY_CARE_PROVIDER_SITE_OTHER): Payer: BLUE CROSS/BLUE SHIELD | Admitting: Adult Health

## 2017-01-10 ENCOUNTER — Other Ambulatory Visit: Payer: Self-pay | Admitting: Adult Health

## 2017-01-10 VITALS — BP 150/80 | HR 82 | Ht 63.5 in | Wt 151.0 lb

## 2017-01-10 DIAGNOSIS — Z7189 Other specified counseling: Secondary | ICD-10-CM

## 2017-01-10 DIAGNOSIS — Z1211 Encounter for screening for malignant neoplasm of colon: Secondary | ICD-10-CM

## 2017-01-10 DIAGNOSIS — Z1212 Encounter for screening for malignant neoplasm of rectum: Secondary | ICD-10-CM

## 2017-01-10 DIAGNOSIS — R232 Flushing: Secondary | ICD-10-CM

## 2017-01-10 DIAGNOSIS — Z780 Asymptomatic menopausal state: Secondary | ICD-10-CM

## 2017-01-10 DIAGNOSIS — Z01419 Encounter for gynecological examination (general) (routine) without abnormal findings: Secondary | ICD-10-CM | POA: Diagnosis not present

## 2017-01-10 DIAGNOSIS — R61 Generalized hyperhidrosis: Secondary | ICD-10-CM

## 2017-01-10 DIAGNOSIS — Z1231 Encounter for screening mammogram for malignant neoplasm of breast: Secondary | ICD-10-CM

## 2017-01-10 LAB — HEMOCCULT GUIAC POC 1CARD (OFFICE): Fecal Occult Blood, POC: NEGATIVE

## 2017-01-10 MED ORDER — ESTRADIOL 1 MG PO TABS: 1.0000 mg | ORAL_TABLET | Freq: Every day | ORAL | 12 refills | Status: DC

## 2017-01-10 NOTE — Patient Instructions (Signed)
Menopause Menopause is the normal time of life when menstrual periods stop completely. Menopause is complete when you have missed 12 consecutive menstrual periods. It usually occurs between the ages of 48 years and 55 years. Very rarely does a woman develop menopause before the age of 40 years. At menopause, your ovaries stop producing the female hormones estrogen and progesterone. This can cause undesirable symptoms and also affect your health. Sometimes the symptoms may occur 4-5 years before the menopause begins. There is no relationship between menopause and:  Oral contraceptives.  Number of children you had.  Race.  The age your menstrual periods started (menarche).  Heavy smokers and very thin women may develop menopause earlier in life. What are the causes?  The ovaries stop producing the female hormones estrogen and progesterone. Other causes include:  Surgery to remove both ovaries.  The ovaries stop functioning for no known reason.  Tumors of the pituitary gland in the brain.  Medical disease that affects the ovaries and hormone production.  Radiation treatment to the abdomen or pelvis.  Chemotherapy that affects the ovaries.  What are the signs or symptoms?  Hot flashes.  Night sweats.  Decrease in sex drive.  Vaginal dryness and thinning of the vagina causing painful intercourse.  Dryness of the skin and developing wrinkles.  Headaches.  Tiredness.  Irritability.  Memory problems.  Weight gain.  Bladder infections.  Hair growth of the face and chest.  Infertility. More serious symptoms include:  Loss of bone (osteoporosis) causing breaks (fractures).  Depression.  Hardening and narrowing of the arteries (atherosclerosis) causing heart attacks and strokes.  How is this diagnosed?  When the menstrual periods have stopped for 12 straight months.  Physical exam.  Hormone studies of the blood. How is this treated? There are many treatment  choices and nearly as many questions about them. The decisions to treat or not to treat menopausal changes is an individual choice made with your health care provider. Your health care provider can discuss the treatments with you. Together, you can decide which treatment will work best for you. Your treatment choices may include:  Hormone therapy (estrogen and progesterone).  Non-hormonal medicines.  Treating the individual symptoms with medicine (for example antidepressants for depression).  Herbal medicines that may help specific symptoms.  Counseling by a psychiatrist or psychologist.  Group therapy.  Lifestyle changes including: ? Eating healthy. ? Regular exercise. ? Limiting caffeine and alcohol. ? Stress management and meditation.  No treatment.  Follow these instructions at home:  Take the medicine your health care provider gives you as directed.  Get plenty of sleep and rest.  Exercise regularly.  Eat a diet that contains calcium (good for the bones) and soy products (acts like estrogen hormone).  Avoid alcoholic beverages.  Do not smoke.  If you have hot flashes, dress in layers.  Take supplements, calcium, and vitamin D to strengthen bones.  You can use over-the-counter lubricants or moisturizers for vaginal dryness.  Group therapy is sometimes very helpful.  Acupuncture may be helpful in some cases. Contact a health care provider if:  You are not sure you are in menopause.  You are having menopausal symptoms and need advice and treatment.  You are still having menstrual periods after age 55 years.  You have pain with intercourse.  Menopause is complete (no menstrual period for 12 months) and you develop vaginal bleeding.  You need a referral to a specialist (gynecologist, psychiatrist, or psychologist) for treatment. Get help right   away if:  You have severe depression.  You have excessive vaginal bleeding.  You fell and think you have a  broken bone.  You have pain when you urinate.  You develop leg or chest pain.  You have a fast pounding heart beat (palpitations).  You have severe headaches.  You develop vision problems.  You feel a lump in your breast.  You have abdominal pain or severe indigestion. This information is not intended to replace advice given to you by your health care provider. Make sure you discuss any questions you have with your health care provider. Document Released: 02/16/2004 Document Revised: 05/03/2016 Document Reviewed: 06/25/2013 Elsevier Interactive Patient Education  2017 [employer] Inc.  

## 2017-01-10 NOTE — Progress Notes (Signed)
Patient ID: Christy Russo, female   DOB: 07-02-1966, 51 y.o.   MRN: [id number] History of Present Illness: Christy Russo is a 51 year old white female, divorced in for a well woman gyn exam,she is sp hysterectomy for fibroids.She complains of night sweats and hot flashes.  PCP is Dr Gerarda Fraction.   Current Medications, Allergies, Past Medical History, Past Surgical History, Family History and Social History were reviewed in [facility] record.     Review of Systems: Patient denies any daily headaches, hearing loss, fatigue, blurred vision, shortness of breath, chest pain, abdominal pain, problems with bowel movements, urination, or intercourse(not having sex). No  mood swings.Has pain in neck and shoulder and is awaiting referral and has appt with Dr Merlene Laughter next week.See HPI for positives.    Physical Exam:BP (!) 150/80 (BP Location: Left Arm, Patient Position: Sitting, Cuff Size: Normal)   Pulse 82   Ht 5' 3.5" (1.613 m)   Wt 151 lb (68.5 kg)   BMI 26.33 kg/m  General:  Well developed, well nourished, no acute distress Skin:  Warm and dry Neck:  Midline trachea, normal thyroid, good ROM, no lymphadenopathy Lungs; Clear to auscultation bilaterally Breast:  No dominant palpable mass, retraction, or nipple discharge Cardiovascular: Regular rate and rhythm Abdomen:  Soft, non tender, no hepatosplenomegaly Pelvic:  External genitalia is normal in appearance, no lesions.  The vagina is normal in appearance. Urethra has no lesions or masses. The cervix and uterus are absent.  No adnexal masses or tenderness noted.Bladder is non tender, no masses felt. Rectal: Good sphincter tone, no polyps, or hemorrhoids felt.  Hemoccult negative. Extremities/musculoskeletal:  No swelling or varicosities noted, no clubbing or cyanosis Psych:  No mood changes, alert and cooperative,seems happy PHQ 2 score 0.Discussed Estrogen therapy and she wants to try it.   Impression: 1. Visit for  gynecologic examination   2. Hot flashes   3. Night sweats   4. Counseling for estrogen replacement therapy   5. Screening for colorectal cancer   6. Menopause       Plan: Rx estrace 1 mg #30 take 1 daily with 12 refills Follow up in 6 weeks if needed Mammogram now and yearly Labs at work Colonoscopy advised Physical in 1 year Review handout on menopause

## 2017-01-11 ENCOUNTER — Ambulatory Visit (HOSPITAL_COMMUNITY)
Admission: RE | Admit: 2017-01-11 | Discharge: 2017-01-11 | Disposition: A | Discharge status: Home / Self Care | Payer: BLUE CROSS/BLUE SHIELD | Source: Ambulatory Visit | Attending: Adult Health | Admitting: Adult Health

## 2017-01-11 DIAGNOSIS — Z1231 Encounter for screening mammogram for malignant neoplasm of breast: Secondary | ICD-10-CM | POA: Insufficient documentation

## 2017-01-11 DIAGNOSIS — R928 Other abnormal and inconclusive findings on diagnostic imaging of breast: Secondary | ICD-10-CM | POA: Diagnosis not present

## 2017-01-14 ENCOUNTER — Other Ambulatory Visit: Payer: Self-pay | Admitting: Adult Health

## 2017-01-14 DIAGNOSIS — R928 Other abnormal and inconclusive findings on diagnostic imaging of breast: Secondary | ICD-10-CM

## 2017-01-14 NOTE — Progress Notes (Signed)
   Subjective:    Patient ID: Christy Russo, female    DOB: 11/19/1966, 51 y.o.   MRN: [id number]  HPI  Pt presents stating she has pain in the posterior aspect of her left heel that has been ongoing for several months.   Review of Systems    All other systems negative Objective:   Physical Exam  Mild tenderness on palpation Lt posterior heel       Assessment & Plan:  ESWT administered to Lt heel at 18 joules for 3000 pulses. Pt tolerated well, with pain reaching a 7 of 10 in certain places on the heel. EPAT delivered to surrounding achilles tendon for 3000 pulses. Advised against usage of NSAIDS and ice, advised on boot usage and utilization of night splint. Re-appointed in 4 weeks for 4th treatment

## 2017-01-18 ENCOUNTER — Ambulatory Visit: Payer: BLUE CROSS/BLUE SHIELD | Admitting: Podiatry

## 2017-01-29 ENCOUNTER — Ambulatory Visit (HOSPITAL_COMMUNITY)
Admission: RE | Admit: 2017-01-29 | Discharge: 2017-01-29 | Disposition: A | Discharge status: Home / Self Care | Payer: BLUE CROSS/BLUE SHIELD | Source: Ambulatory Visit | Attending: Adult Health | Admitting: Adult Health

## 2017-01-29 DIAGNOSIS — R928 Other abnormal and inconclusive findings on diagnostic imaging of breast: Secondary | ICD-10-CM | POA: Diagnosis present

## 2017-01-29 DIAGNOSIS — N6002 Solitary cyst of left breast: Secondary | ICD-10-CM | POA: Insufficient documentation

## 2017-02-21 ENCOUNTER — Ambulatory Visit: Payer: BLUE CROSS/BLUE SHIELD | Admitting: Adult Health

## 2017-02-23 ENCOUNTER — Encounter (HOSPITAL_COMMUNITY): Payer: Self-pay | Admitting: Emergency Medicine

## 2017-02-23 ENCOUNTER — Emergency Department (HOSPITAL_COMMUNITY)
Admission: EM | Admit: 2017-02-23 | Discharge: 2017-02-23 | Disposition: A | Discharge status: Home / Self Care | Payer: BLUE CROSS/BLUE SHIELD | Attending: Emergency Medicine | Admitting: Emergency Medicine

## 2017-02-23 DIAGNOSIS — M5030 Other cervical disc degeneration, unspecified cervical region: Secondary | ICD-10-CM

## 2017-02-23 DIAGNOSIS — M542 Cervicalgia: Secondary | ICD-10-CM | POA: Diagnosis present

## 2017-02-23 DIAGNOSIS — M5010 Cervical disc disorder with radiculopathy, unspecified cervical region: Secondary | ICD-10-CM | POA: Diagnosis not present

## 2017-02-23 DIAGNOSIS — M5412 Radiculopathy, cervical region: Secondary | ICD-10-CM

## 2017-02-23 DIAGNOSIS — Z79899 Other long term (current) drug therapy: Secondary | ICD-10-CM | POA: Diagnosis not present

## 2017-02-23 MED ORDER — KETOROLAC TROMETHAMINE 60 MG/2ML IM SOLN
60.0000 mg | Freq: Once | INTRAMUSCULAR | Status: AC
  Administered 2017-02-23: 60 mg via INTRAMUSCULAR
  Filled 2017-02-23: qty 2

## 2017-02-23 MED ORDER — DEXAMETHASONE SODIUM PHOSPHATE 4 MG/ML IJ SOLN
8.0000 mg | Freq: Once | INTRAMUSCULAR | Status: AC
  Administered 2017-02-23: 8 mg via INTRAMUSCULAR
  Filled 2017-02-23: qty 2

## 2017-02-23 NOTE — ED Provider Notes (Signed)
[facility] DEPT Provider Note   CSN: [id number] Arrival date & time: 02/23/17  1024     History   Chief Complaint Chief Complaint  Patient presents with  . Neck Pain  . Shoulder Pain    HPI Christy Russo is a 51 y.o. female.  Patient is a 51 year old female who presents to the emergency department with a complaint of neck and shoulder pain.  The patient states that she has a "pinched nerve" in her neck. She is being seen by neurosurgeon as well as by her primary physician. Her neurosurgeon is as initiated injections in the neck to assist with her pain. She states she is in between injections, and is having increase in pain. She also complains of some tingling, and at times some numbness particularly the right arm. The patient states that she was instructed to come to the emergency department for additional pain management if she had pain in between the injections. She presents now because she is having additional pain. She denies any increased use of the neck or the right upper extremity. She's not had any recent injury or trauma. There's been no operations and no procedures other than the injection that was done earlier.      Past Medical History:  Diagnosis Date  . Anemia   . Anxiety   . Depression   . Fibroids   . GERD (gastroesophageal reflux disease)   . Kidney stones   . Osteoarthritis   . Pinched nerve in neck     Patient Active Problem List   Diagnosis Date Noted  . Post-operative state 04/24/2016  . Depression, major, recurrent, moderate ([place]) 04/15/2016  . GAD (generalized anxiety disorder) 04/15/2016  . Benzodiazepine dependence, continuous ([place]) 04/15/2016  . Alcohol use disorder, moderate, dependence ([place]) 04/14/2016  . Hammertoe 03/06/2016    Past Surgical History:  Procedure Laterality Date  . ABDOMINAL HYSTERECTOMY    . c-section x 2    . CESAREAN SECTION     x 2  . TOE SURGERY Left 03/21/2016  . TUBAL LIGATION      OB History    Gravida Para Term Preterm AB Living   2 2 2     2    SAB TAB Ectopic Multiple Live Births           2       Home Medications    Prior to Admission medications   Medication Sig Start Date End Date Taking? Authorizing Provider  ALPRAZolam Duanne Moron) 1 MG tablet Take 1 mg by mouth 3 (three) times daily.  05/05/16  Yes Historical Provider, MD  busPIRone (BUSPAR) 5 MG tablet Take 5 mg by mouth 2 (two) times daily.  12/31/16  Yes Historical Provider, MD  DULoxetine (CYMBALTA) 60 MG capsule Take 1 capsule (60 mg total) by mouth daily. For depression 04/17/16  Yes Encarnacion Slates, NP  estradiol (ESTRACE) 1 MG tablet Take 1 tablet (1 mg total) by mouth daily. Patient not taking: Reported on 02/23/2017 01/10/17   Estill Dooms, NP    Family History Family History  Problem Relation Age of Onset  . Congestive Heart Failure Maternal Grandmother   . Heart disease Father   . Hypertension Mother   . Heart disease Mother   . Diabetes Mother     Social History Social History  Substance Use Topics  . Smoking status: Never Smoker  . Smokeless tobacco: Never Used  . Alcohol use Yes     Comment: Occasionally  Allergies   Codeine   Review of Systems Review of Systems  Musculoskeletal: Positive for neck pain.  Neurological:       Tingling     Physical Exam Updated Vital Signs BP 130/66 (BP Location: Right Arm)   Pulse 88   Temp 97.9 F (36.6 C) (Oral)   Resp 14   Ht 5\' 5"  (1.651 m)   Wt 68 kg   SpO2 94%   BMI 24.96 kg/m   Physical Exam  Constitutional: She is oriented to person, place, and time. She appears well-developed and well-nourished.  Non-toxic appearance.  HENT:  Head: Normocephalic.  Right Ear: Tympanic membrane and external ear normal.  Left Ear: Tympanic membrane and external ear normal.  Eyes: EOM and lids are normal. Pupils are equal, round, and reactive to light.  Neck: Normal range of motion. Neck supple. Carotid bruit is not present.  Cardiovascular: Normal  rate, regular rhythm, normal heart sounds, intact distal pulses and normal pulses.   Pulmonary/Chest: Breath sounds normal. No respiratory distress.  Abdominal: Soft. Bowel sounds are normal. There is no tenderness. There is no guarding.  Musculoskeletal: Normal range of motion.  There is pain with rotation of the right shoulder. Is no atrophy of the right upper extremity.  There is no palpable step off of the cervical or thoracic spine area.  Lymphadenopathy:       Head (right side): No submandibular adenopathy present.       Head (left side): No submandibular adenopathy present.    She has no cervical adenopathy.  Neurological: She is alert and oriented to person, place, and time. She has normal strength. No cranial nerve deficit or sensory deficit. Coordination normal.  There no motor or sensory deficits appreciated on the upper extremity exam. Grip is symmetrical. Gait is intact. There is no foot drop or loss of balance or change in coordination.  Skin: Skin is warm and dry.  Psychiatric: She has a normal mood and affect. Her speech is normal.  Nursing note and vitals reviewed.    ED Treatments / Results  Labs (all labs ordered are listed, but only abnormal results are displayed) Labs Reviewed - No data to display  EKG  EKG Interpretation None       Radiology No results found.  Procedures Procedures (including critical care time)  Medications Ordered in ED Medications  dexamethasone (DECADRON) injection 8 mg (8 mg Intramuscular Given 02/23/17 1224)  ketorolac (TORADOL) injection 60 mg (60 mg Intramuscular Given 02/23/17 1224)     Initial Impression / Assessment and Plan / ED Course  I have reviewed the triage vital signs and the nursing notes.  Pertinent labs & imaging results that were available during my care of the patient were reviewed by me and considered in my medical decision making (see chart for details).     **I have reviewed nursing notes, vital signs,  and all appropriate lab and imaging results for this patient.*  Final Clinical Impressions(s) / ED Diagnoses MDM Vital signs within normal limits. There no gross neurologic deficits appreciated. I reviewed the MR was done on the patient in January. Patient has degenerative disc disease with foraminal narrowing at multiple sites. The patient is being managed by her primary physician as well as the neurosurgeon. The patient states that she was told to come to the emergency department for pain management if she had pain in between the injections. However the neurosurgeon did not give the patient a particular prescription, has not seen any  information to the emergency department for assistance in their desired management. And has not contacted the emergency department today to provide guidance for the patient's care.  No gross neurologic deficit appreciated. The patient is having breakthrough pain related to her degenerative disc disease. The patient will be treated with Decadron and Toradol, and she is driving. The patient is strongly encouraged to see her neurosurgeon and her primary physician for assistance with her breakthrough pain.    Final diagnoses:  Cervical radiculopathy  DDD (degenerative disc disease), cervical    New Prescriptions Discharge Medication List as of 02/23/2017 12:33 PM       Lily Kocher, PA-C 02/25/17 1334    Milton Ferguson, MD 02/25/17 1440

## 2017-02-23 NOTE — Discharge Instructions (Signed)
Your vital signs within normal limits. There are no acute neurologic deficits appreciated on your exam in this time. Please see your neurosurgery specialist for recheck concerning your breakthrough pain. You were treated in the emergency department with intramuscular steroids and intramuscular anti-inflammatory pain medication.

## 2017-02-23 NOTE — ED Triage Notes (Signed)
Pt reports she has a "pinched nerve" and is followed by a neurosurgeon. States she is in between injections and was instructed to come to ED if she had a flare up. Denies any injury.

## 2017-03-13 ENCOUNTER — Emergency Department (HOSPITAL_COMMUNITY): Payer: BLUE CROSS/BLUE SHIELD

## 2017-03-13 ENCOUNTER — Encounter (HOSPITAL_COMMUNITY): Payer: Self-pay | Admitting: *Deleted

## 2017-03-13 ENCOUNTER — Emergency Department (HOSPITAL_COMMUNITY)
Admission: EM | Admit: 2017-03-13 | Discharge: 2017-03-13 | Disposition: A | Discharge status: Home / Self Care | Payer: BLUE CROSS/BLUE SHIELD | Attending: Emergency Medicine | Admitting: Emergency Medicine

## 2017-03-13 DIAGNOSIS — R0602 Shortness of breath: Secondary | ICD-10-CM | POA: Diagnosis not present

## 2017-03-13 DIAGNOSIS — Z79899 Other long term (current) drug therapy: Secondary | ICD-10-CM | POA: Insufficient documentation

## 2017-03-13 DIAGNOSIS — R0789 Other chest pain: Secondary | ICD-10-CM | POA: Diagnosis not present

## 2017-03-13 DIAGNOSIS — K5900 Constipation, unspecified: Secondary | ICD-10-CM | POA: Insufficient documentation

## 2017-03-13 DIAGNOSIS — R1084 Generalized abdominal pain: Secondary | ICD-10-CM | POA: Diagnosis present

## 2017-03-13 LAB — COMPREHENSIVE METABOLIC PANEL
ALBUMIN: 4 g/dL (ref 3.5–5.0)
ALT: 104 U/L — ABNORMAL HIGH (ref 14–54)
AST: 57 U/L — AB (ref 15–41)
Alkaline Phosphatase: 76 U/L (ref 38–126)
Anion gap: 6 (ref 5–15)
BILIRUBIN TOTAL: 0.8 mg/dL (ref 0.3–1.2)
BUN: 7 mg/dL (ref 6–20)
CO2: 30 mmol/L (ref 22–32)
Calcium: 9.4 mg/dL (ref 8.9–10.3)
Chloride: 101 mmol/L (ref 101–111)
Creatinine, Ser: 0.5 mg/dL (ref 0.44–1.00)
GFR calc Af Amer: >60 mL/min (ref >60)
GFR calc non Af Amer: >60 mL/min (ref >60)
GLUCOSE: 162 mg/dL — AB (ref 65–99)
Potassium: 3.8 mmol/L (ref 3.5–5.1)
SODIUM: 137 mmol/L (ref 135–145)
TOTAL PROTEIN: 7.4 g/dL (ref 6.5–8.1)

## 2017-03-13 LAB — CBC WITH DIFFERENTIAL/PLATELET
BASOS PCT: 0 %
Basophils Absolute: 0 10*3/uL (ref 0.0–0.1)
Eosinophils Absolute: 0.3 10*3/uL (ref 0.0–0.7)
Eosinophils Relative: 3 %
HCT: 38.3 % (ref 36.0–46.0)
Hemoglobin: 13.2 g/dL (ref 12.0–15.0)
LYMPHS ABS: 1.6 10*3/uL (ref 0.7–4.0)
LYMPHS PCT: 17 %
MCH: 30.1 pg (ref 26.0–34.0)
MCHC: 34.5 g/dL (ref 30.0–36.0)
MCV: 87.2 fL (ref 78.0–100.0)
MONO ABS: 0.9 10*3/uL (ref 0.1–1.0)
MONOS PCT: 9 %
Neutro Abs: 6.8 10*3/uL (ref 1.7–7.7)
Neutrophils Relative %: 71 %
Platelets: 258 10*3/uL (ref 150–400)
RBC: 4.39 MIL/uL (ref 3.87–5.11)
RDW: 13.6 % (ref 11.5–15.5)
WBC: 9.5 10*3/uL (ref 4.0–10.5)

## 2017-03-13 LAB — LIPASE, BLOOD: Lipase: 47 U/L (ref 11–51)

## 2017-03-13 LAB — TROPONIN I: Troponin I: <0.03 ng/mL (ref <0.03)

## 2017-03-13 LAB — D-DIMER, QUANTITATIVE: D-Dimer, Quant: 2.53 ug/mL-FEU — ABNORMAL HIGH (ref 0.00–0.50)

## 2017-03-13 MED ORDER — ONDANSETRON HCL 4 MG/2ML IJ SOLN
4.0000 mg | Freq: Once | INTRAMUSCULAR | Status: AC
  Administered 2017-03-13: 4 mg via INTRAVENOUS

## 2017-03-13 MED ORDER — ONDANSETRON HCL 4 MG/2ML IJ SOLN
INTRAMUSCULAR | Status: AC
  Administered 2017-03-13: 4 mg via INTRAVENOUS
  Filled 2017-03-13: qty 2

## 2017-03-13 MED ORDER — IOPAMIDOL (ISOVUE-370) INJECTION 76%
100.0000 mL | Freq: Once | INTRAVENOUS | Status: AC | PRN
  Administered 2017-03-13: 100 mL via INTRAVENOUS

## 2017-03-13 MED ORDER — SODIUM CHLORIDE 0.9 % IV BOLUS (SEPSIS)
1000.0000 mL | Freq: Once | INTRAVENOUS | Status: AC
  Administered 2017-03-13: 1000 mL via INTRAVENOUS

## 2017-03-13 MED ORDER — ONDANSETRON HCL 4 MG/2ML IJ SOLN
4.0000 mg | Freq: Once | INTRAMUSCULAR | Status: AC
  Administered 2017-03-13: 4 mg via INTRAVENOUS
  Filled 2017-03-13: qty 2

## 2017-03-13 MED ORDER — ONDANSETRON HCL 4 MG PO TABS: 4.0000 mg | ORAL_TABLET | Freq: Four times a day (QID) | ORAL | 0 refills | Status: DC

## 2017-03-13 NOTE — ED Triage Notes (Signed)
Pt comes in for constipation. Pt states within the last week she has only one soft bowel movement (this morning). Pt has distended abdomen in triage.

## 2017-03-13 NOTE — Discharge Instructions (Signed)
You can try taking milk of magnesia as directed.  Increase your water intake and walk.  Call your doctor to arrange a follow-up appt.

## 2017-03-13 NOTE — ED Provider Notes (Signed)
[facility] DEPT Provider Note   CSN: [id number] Arrival date & time: 03/13/17  3382     History   Chief Complaint Chief Complaint  Patient presents with  . Constipation    HPI Christy Russo is a 51 y.o. female.  HPI   Christy HENDRICKSEN is a 51 y.o. female who is 9 days post-op from a cervical fusion by Dr. Trenton Gammon presents to the Emergency Department complaining of generalized abdominal pain, chest pain and shortness of breath.  She states that she has been constipated since her surgery. She reports one BM within one week.  States her abdomen feels bloated.  She also reports feeling tightness of her chest and associated shortness of breath.  She states that she was given narcotic pain medication after surgery that she states "made me feel funny and nauseous" so her medication was changed to tramadol.  She denies fever, vomiting, dysuria, diaphoresis and hx of PE/DVT   Past Medical History:  Diagnosis Date  . Anemia   . Anxiety   . Depression   . Fibroids   . GERD (gastroesophageal reflux disease)   . Kidney stones   . Osteoarthritis   . Pinched nerve in neck     Patient Active Problem List   Diagnosis Date Noted  . Post-operative state 04/24/2016  . Depression, major, recurrent, moderate ([place]) 04/15/2016  . GAD (generalized anxiety disorder) 04/15/2016  . Benzodiazepine dependence, continuous ([place]) 04/15/2016  . Alcohol use disorder, moderate, dependence ([place]) 04/14/2016  . Hammertoe 03/06/2016    Past Surgical History:  Procedure Laterality Date  . ABDOMINAL HYSTERECTOMY    . c-section x 2    . CESAREAN SECTION     x 2  . NECK SURGERY    . TOE SURGERY Left 03/21/2016  . TUBAL LIGATION      OB History    Gravida Para Term Preterm AB Living   2 2 2     2    SAB TAB Ectopic Multiple Live Births           2       Home Medications    Prior to Admission medications   Medication Sig Start Date End Date Taking? Authorizing Provider  ALPRAZolam  Duanne Moron) 1 MG tablet Take 1 mg by mouth 4 (four) times daily.  05/05/16  Yes Historical Provider, MD  busPIRone (BUSPAR) 5 MG tablet Take 5 mg by mouth 2 (two) times daily.  12/31/16  Yes Historical Provider, MD  DULoxetine (CYMBALTA) 60 MG capsule Take 1 capsule (60 mg total) by mouth daily. For depression 04/17/16  Yes Encarnacion Slates, NP  omeprazole (PRILOSEC) 20 MG capsule Take 1 capsule by mouth daily. 02/20/17  Yes Historical Provider, MD  traMADol (ULTRAM) 50 MG tablet Take 1-2 tablets by mouth 3 (three) times daily. 03/07/17  Yes Historical Provider, MD  estradiol (ESTRACE) 1 MG tablet Take 1 tablet (1 mg total) by mouth daily. Patient not taking: Reported on 02/23/2017 01/10/17   Estill Dooms, NP    Family History Family History  Problem Relation Age of Onset  . Congestive Heart Failure Maternal Grandmother   . Heart disease Father   . Hypertension Mother   . Heart disease Mother   . Diabetes Mother     Social History Social History  Substance Use Topics  . Smoking status: Never Smoker  . Smokeless tobacco: Never Used  . Alcohol use Yes     Comment: Occasionally     Allergies  Codeine   Review of Systems Review of Systems  Constitutional: Negative for appetite change, chills and fever.  HENT: Negative for trouble swallowing.   Respiratory: Positive for chest tightness and shortness of breath. Negative for cough.   Cardiovascular: Positive for chest pain.  Gastrointestinal: Positive for abdominal distention, abdominal pain, constipation and nausea. Negative for vomiting.  Genitourinary: Negative for difficulty urinating and dysuria.  Musculoskeletal: Negative for arthralgias and neck pain.  Skin: Negative for rash.  Neurological: Negative for dizziness, syncope, numbness and headaches.     Physical Exam Updated Vital Signs BP (!) 174/74 (BP Location: Right Arm)   Pulse 85   Temp 97.9 F (36.6 C) (Oral)   Resp 18   Ht 5\' 5"  (1.651 m)   Wt 68 kg   SpO2 98%    BMI 24.96 kg/m   Physical Exam  Constitutional: She is oriented to person, place, and time. She appears well-developed and well-nourished. No distress.  HENT:  Mouth/Throat: Oropharynx is clear and moist.  Eyes: EOM are normal. Pupils are equal, round, and reactive to light.  Neck:  surgical incision of the anterior neck.  No edema, erythema or drainage  Cardiovascular: Normal rate, regular rhythm and intact distal pulses.   No murmur heard. Pulmonary/Chest: Effort normal and breath sounds normal. No respiratory distress.  Abdominal: Soft. Bowel sounds are normal. She exhibits distension. She exhibits no mass. There is generalized tenderness and tenderness in the epigastric area. There is no rebound and no guarding.  Musculoskeletal: Normal range of motion.  Lymphadenopathy:    She has no cervical adenopathy.  Neurological: She is alert and oriented to person, place, and time. She has normal strength. No sensory deficit. Coordination normal. GCS eye subscore is 4. GCS verbal subscore is 5. GCS motor subscore is 6.  CN II-XII grossly intact  Skin: Skin is warm. No rash noted.  Psychiatric: Her behavior is normal.  Nursing note and vitals reviewed.    ED Treatments / Results  Labs (all labs ordered are listed, but only abnormal results are displayed) Labs Reviewed  COMPREHENSIVE METABOLIC PANEL - Abnormal; Notable for the following:       Result Value   Glucose, Bld 162 (*)    AST 57 (*)    ALT 104 (*)    All other components within normal limits  D-DIMER, QUANTITATIVE (NOT AT [facility]) - Abnormal; Notable for the following:    D-Dimer, Quant 2.53 (*)    All other components within normal limits  LIPASE, BLOOD  CBC WITH DIFFERENTIAL/PLATELET  TROPONIN I    EKG  EKG Interpretation  Date/Time:  Wednesday March 13 2017 11:46:56 EDT Ventricular Rate:  79 PR Interval:    QRS Duration: 89 QT Interval:  365 QTC Calculation: 419 R Axis:   75 Text Interpretation:  Sinus rhythm  Low voltage, precordial leads Confirmed by WARD,  DO, KRISTEN [phone]) on 03/14/2017 10:38:46 AM       Radiology Dg Abdomen 1 View  Result Date: 03/13/2017 CLINICAL DATA:  Clinical suspicion of constipation. The patient reports abdominal pain and nausea and has undergone recent neck surgery. EXAM: ABDOMEN - 1 VIEW COMPARISON:  CT scan of the abdomen and pelvis of March 04, 2017 FINDINGS: The colonic stool burden does not appear increased. No small or large bowel obstructive pattern is observed. There is stool and gas in the rectum. There are no abnormal soft tissue calcifications. The bony structures are unremarkable. IMPRESSION: The colonic stool burden is felt to be  normal. No acute intra-abdominal abnormality is observed. Electronically Signed   By: David  Martinique M.D.   On: 03/13/2017 12:39   Ct Angio Chest Pe W And/or Wo Contrast  Result Date: 03/13/2017 CLINICAL DATA:  Anterior chest pain with shortness of breath. EXAM: CT ANGIOGRAPHY CHEST WITH CONTRAST TECHNIQUE: Multidetector CT imaging of the chest was performed using the standard protocol during bolus administration of intravenous contrast. Multiplanar CT image reconstructions and MIPs were obtained to evaluate the vascular anatomy. CONTRAST:  100 cc Isovue 370 COMPARISON:  None. FINDINGS: Cardiovascular: No pulmonary emboli. Slight aortic atherosclerosis. Mediastinum/Nodes: No enlarged mediastinal, hilar, or axillary lymph nodes. Thyroid gland, trachea, and esophagus demonstrate no significant findings. Lungs/Pleura: Lungs are clear. No pleural effusion or pneumothorax. Upper Abdomen: 2.2 cm gallstone.  Otherwise normal. Musculoskeletal: No chest wall abnormality. No acute or significant osseous findings. Review of the MIP images confirms the above findings. IMPRESSION: No acute abnormality.  Specifically, no pulmonary emboli. Aortic atherosclerosis. Electronically Signed   By: Lorriane Shire M.D.   On: 03/13/2017 14:51      Procedures Procedures (including critical care time)  Medications Ordered in ED Medications  sodium chloride 0.9 % bolus 1,000 mL (0 mLs Intravenous Stopped 03/13/17 1314)  ondansetron (ZOFRAN) injection 4 mg (4 mg Intravenous Given 03/13/17 1154)  iopamidol (ISOVUE-370) 76 % injection 100 mL (100 mLs Intravenous Contrast Given 03/13/17 1420)  ondansetron (ZOFRAN) injection 4 mg (4 mg Intravenous Given 03/13/17 1504)     Initial Impression / Assessment and Plan / ED Course  I have reviewed the triage vital signs and the nursing notes.  Pertinent labs & imaging results that were available during my care of the patient were reviewed by me and considered in my medical decision making (see chart for details).     Post op patient with generalized abd pain, chest pain and dyspnea.  CT angio neg for for PE.  Pt agrees to use Miralax or MOM for sx's of abd pain bloating and constipation. Pt also advised to d/c ultram as may be cause of her sx's. Vitals stable.  Pt stable for d/c.  Has PMD and surgical f/u.  Return precautions discussed.  Final Clinical Impressions(s) / ED Diagnoses   Final diagnoses:  Generalized abdominal pain    New Prescriptions New Prescriptions   No medications on file     Bufford Lope 03/17/17 1627    Milton Ferguson, MD 03/17/17 [phone]

## 2017-03-15 ENCOUNTER — Encounter (HOSPITAL_COMMUNITY): Payer: Self-pay

## 2017-03-15 ENCOUNTER — Emergency Department (HOSPITAL_COMMUNITY)
Admission: EM | Admit: 2017-03-15 | Discharge: 2017-03-16 | Disposition: A | Discharge status: Home / Self Care | Payer: BLUE CROSS/BLUE SHIELD | Attending: Emergency Medicine | Admitting: Emergency Medicine

## 2017-03-15 DIAGNOSIS — N390 Urinary tract infection, site not specified: Secondary | ICD-10-CM | POA: Diagnosis not present

## 2017-03-15 DIAGNOSIS — Z79899 Other long term (current) drug therapy: Secondary | ICD-10-CM | POA: Insufficient documentation

## 2017-03-15 DIAGNOSIS — R440 Auditory hallucinations: Secondary | ICD-10-CM | POA: Insufficient documentation

## 2017-03-15 DIAGNOSIS — R197 Diarrhea, unspecified: Secondary | ICD-10-CM | POA: Insufficient documentation

## 2017-03-15 DIAGNOSIS — R441 Visual hallucinations: Secondary | ICD-10-CM | POA: Diagnosis not present

## 2017-03-15 DIAGNOSIS — R410 Disorientation, unspecified: Secondary | ICD-10-CM | POA: Diagnosis not present

## 2017-03-15 LAB — URINALYSIS, ROUTINE W REFLEX MICROSCOPIC
BILIRUBIN URINE: NEGATIVE
Glucose, UA: NEGATIVE mg/dL
HGB URINE DIPSTICK: NEGATIVE
KETONES UR: NEGATIVE mg/dL
NITRITE: POSITIVE — AB
PH: 7 (ref 5.0–8.0)
Protein, ur: NEGATIVE mg/dL
SPECIFIC GRAVITY, URINE: 1.004 — AB (ref 1.005–1.030)

## 2017-03-15 LAB — COMPREHENSIVE METABOLIC PANEL
ALK PHOS: 70 U/L (ref 38–126)
ALT: 61 U/L — ABNORMAL HIGH (ref 14–54)
AST: 36 U/L (ref 15–41)
Albumin: 3.8 g/dL (ref 3.5–5.0)
Anion gap: 8 (ref 5–15)
BILIRUBIN TOTAL: 1 mg/dL (ref 0.3–1.2)
BUN: 11 mg/dL (ref 6–20)
CALCIUM: 9.2 mg/dL (ref 8.9–10.3)
CO2: 32 mmol/L (ref 22–32)
Chloride: 96 mmol/L — ABNORMAL LOW (ref 101–111)
Creatinine, Ser: 0.72 mg/dL (ref 0.44–1.00)
GLUCOSE: 135 mg/dL — AB (ref 65–99)
Potassium: 3.7 mmol/L (ref 3.5–5.1)
Sodium: 136 mmol/L (ref 135–145)
Total Protein: 7.5 g/dL (ref 6.5–8.1)

## 2017-03-15 LAB — CBC WITH DIFFERENTIAL/PLATELET
Basophils Absolute: 0.1 10*3/uL (ref 0.0–0.1)
Basophils Relative: 1 %
Eosinophils Absolute: 0.5 10*3/uL (ref 0.0–0.7)
Eosinophils Relative: 6 %
HEMATOCRIT: 37.3 % (ref 36.0–46.0)
HEMOGLOBIN: 12.5 g/dL (ref 12.0–15.0)
Lymphocytes Relative: 28 %
Lymphs Abs: 2.5 10*3/uL (ref 0.7–4.0)
MCH: 30 pg (ref 26.0–34.0)
MCHC: 33.5 g/dL (ref 30.0–36.0)
MCV: 89.4 fL (ref 78.0–100.0)
MONOS PCT: 10 %
Monocytes Absolute: 0.9 10*3/uL (ref 0.1–1.0)
NEUTROS ABS: 5 10*3/uL (ref 1.7–7.7)
NEUTROS PCT: 55 %
Platelets: 316 10*3/uL (ref 150–400)
RBC: 4.17 MIL/uL (ref 3.87–5.11)
RDW: 13.9 % (ref 11.5–15.5)
WBC: 9 10*3/uL (ref 4.0–10.5)

## 2017-03-15 LAB — RAPID URINE DRUG SCREEN, HOSP PERFORMED
AMPHETAMINES: NOT DETECTED
BARBITURATES: NOT DETECTED
BENZODIAZEPINES: POSITIVE — AB
COCAINE: NOT DETECTED
Opiates: NOT DETECTED
TETRAHYDROCANNABINOL: NOT DETECTED

## 2017-03-15 LAB — ETHANOL

## 2017-03-15 MED ORDER — TRAMADOL HCL 50 MG PO TABS
50.0000 mg | ORAL_TABLET | ORAL | Status: DC | PRN
  Administered 2017-03-15: 50 mg via ORAL
  Filled 2017-03-15: qty 1

## 2017-03-15 MED ORDER — CEFTRIAXONE SODIUM 1 G IJ SOLR
1.0000 g | Freq: Once | INTRAMUSCULAR | Status: AC
  Administered 2017-03-15: 1 g via INTRAVENOUS
  Filled 2017-03-15: qty 10
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
## 2017-03-15 NOTE — ED Provider Notes (Signed)
[facility] DEPT Provider Note   CSN: [id number] Arrival date & time: 03/15/17  1137     History   Chief Complaint Chief Complaint  Patient presents with  . Altered Mental Status    HPI Christy Russo is a 51 y.o. female who presents to the Emergency Department with altered mental status. She presents with complaints of hallucinations and confusion. She reports she awoke this morning around 5:30 AM and and heard her deceased father's voice calling her name from outside her door. She opened the door and watched him walk through her apartment and go into her bedroom and lay down in her bed. She reports shortly thereafter that she heard her mother, who is not deceased and lives in [place], calling her name and walking through her apartment. She reports that she spoke with her sister on the phone this morning, and she has confused about what day it was and encouraged her to come to the ED for evaluation. Her mother reports yesterday that she was more sleepy throughout the day, and the patient reports she took two Xanax yesterday. She denies SI and HI.   She was evaluated in the ED on 4/4 for constipation and discharged with Zofran. She went home and took milk of magnesia and reports she awoke in the middle of the night and had soiled her bed. She reports multiple episodes of diarrhea yesterday and treated her symptoms with loperamide. She reports continued diarrhea today with associated diffuse abdominal pain. She denies N/V, chest pain, dyspnea, or dysuria.   She keeps a log of her medications. Other recent medication changes include starting Tramadol after cervical spine surgery by Dr. Trenton Gammon 1.5 weeks ago. PMH includes GAD and depression. Daily medications include duloxetine, omeprazole, alprazalam, gabapentin, and buspirone.  She lives alone.   HPI  Past Medical History:  Diagnosis Date  . Anemia   . Anxiety   . Depression   . Fibroids   . GERD (gastroesophageal reflux disease)   .  Kidney stones   . Osteoarthritis   . Pinched nerve in neck     Patient Active Problem List   Diagnosis Date Noted  . Confusion, postoperative 03/16/2017  . Post-operative state 04/24/2016  . Depression, major, recurrent, moderate ([place]) 04/15/2016  . GAD (generalized anxiety disorder) 04/15/2016  . Benzodiazepine dependence, continuous ([place]) 04/15/2016  . Alcohol use disorder, moderate, dependence ([place]) 04/14/2016  . Hammertoe 03/06/2016    Past Surgical History:  Procedure Laterality Date  .  neck sx 03/17/17    . ABDOMINAL HYSTERECTOMY    . c-section x 2    . CESAREAN SECTION     x 2  . NECK SURGERY    . TOE SURGERY Left 03/21/2016  . TUBAL LIGATION      OB History    Gravida Para Term Preterm AB Living   2 2 2     2    SAB TAB Ectopic Multiple Live Births           2       Home Medications    Prior to Admission medications   Medication Sig Start Date End Date Taking? Authorizing Provider  acetaminophen (TYLENOL) 500 MG tablet Take 1,000 mg by mouth every 6 (six) hours as needed for moderate pain.   Yes Historical Provider, MD  ALPRAZolam Duanne Moron) 1 MG tablet Take 1 mg by mouth 4 (four) times daily.  05/05/16  Yes Historical Provider, MD  busPIRone (BUSPAR) 5 MG tablet Take 5 mg by  mouth 2 (two) times daily.  12/31/16  Yes Historical Provider, MD  diphenhydrAMINE (BENADRYL) 25 mg capsule Take 25 mg by mouth daily as needed for allergies.   Yes Historical Provider, MD  DULoxetine (CYMBALTA) 60 MG capsule Take 1 capsule (60 mg total) by mouth daily. For depression 04/17/16  Yes Encarnacion Slates, NP  omeprazole (PRILOSEC) 20 MG capsule Take 1 capsule by mouth daily. 02/20/17  Yes Historical Provider, MD  traMADol (ULTRAM) 50 MG tablet Take 1-2 tablets by mouth 3 (three) times daily as needed for moderate pain.  03/07/17  Yes Historical Provider, MD  cephALEXin (KEFLEX) 500 MG capsule Take 1 capsule (500 mg total) by mouth 4 (four) times daily. 03/16/17   Noemi Chapel, MD  estradiol  (ESTRACE) 1 MG tablet Take 1 tablet (1 mg total) by mouth daily. Patient not taking: Reported on 02/23/2017 01/10/17   Estill Dooms, NP  ondansetron (ZOFRAN) 4 MG tablet Take 1 tablet (4 mg total) by mouth every 6 (six) hours. 03/13/17   Tammy Triplett, PA-C    Family History Family History  Problem Relation Age of Onset  . Congestive Heart Failure Maternal Grandmother   . Heart disease Father   . Hypertension Mother   . Heart disease Mother   . Diabetes Mother     Social History Social History  Substance Use Topics  . Smoking status: Never Smoker  . Smokeless tobacco: Never Used  . Alcohol use No     Allergies   Codeine   Review of Systems Review of Systems  Constitutional: Negative for fever.  Respiratory: Negative for shortness of breath.   Cardiovascular: Negative for chest pain.  Gastrointestinal: Positive for abdominal pain, constipation (resolved) and diarrhea. Negative for nausea and vomiting.  Genitourinary: Negative for dysuria.  Musculoskeletal: Negative for back pain.  Skin: Negative for rash.  Allergic/Immunologic: Negative for immunocompromised state.  Neurological: Negative for syncope.  Psychiatric/Behavioral: Positive for confusion and hallucinations.  All other systems reviewed and are negative.  Physical Exam Updated Vital Signs BP 117/66 (BP Location: Left Arm)   Pulse 85   Temp 98.7 F (37.1 C) (Oral)   Resp 15   Ht 5\' 5"  (1.651 m)   Wt 68 kg   SpO2 93%   BMI 24.96 kg/m   Physical Exam  Constitutional: She appears well-developed and well-nourished. No distress.  HENT:  Head: Normocephalic and atraumatic.  Eyes: Conjunctivae and EOM are normal. Pupils are equal, round, and reactive to light.  Neck: Neck supple.  Cardiovascular: Normal rate, regular rhythm and normal heart sounds.  Exam reveals no gallop and no friction rub.   No murmur heard. Pulmonary/Chest: Effort normal and breath sounds normal. No respiratory distress. She has no  wheezes. She has no rales.  Abdominal: Soft. She exhibits distension. Bowel sounds are increased. There is no tenderness. There is no rebound and no guarding.  Musculoskeletal: She exhibits no edema or tenderness.  Neurological: She is alert. She has normal strength. No cranial nerve deficit or sensory deficit. Gait normal. GCS eye subscore is 4. GCS verbal subscore is 5. GCS motor subscore is 6.  She is oriented to person and place, but not to time. States that it is 2016 and today is Tuesday or Saturday. Cannot correctly identify the president.   Skin: Skin is warm and dry. No rash noted.  Psychiatric: She has a normal mood and affect.  Nursing note and vitals reviewed.  ED Treatments / Results  Labs (all labs ordered  are listed, but only abnormal results are displayed) Labs Reviewed  URINALYSIS, ROUTINE W REFLEX MICROSCOPIC - Abnormal; Notable for the following:       Result Value   APPearance HAZY (*)    Specific Gravity, Urine 1.004 (*)    Nitrite POSITIVE (*)    Leukocytes, UA LARGE (*)    Bacteria, UA FEW (*)    Squamous Epithelial / LPF 0-5 (*)    All other components within normal limits  COMPREHENSIVE METABOLIC PANEL - Abnormal; Notable for the following:    Chloride 96 (*)    Glucose, Bld 135 (*)    ALT 61 (*)    All other components within normal limits  RAPID URINE DRUG SCREEN, HOSP PERFORMED - Abnormal; Notable for the following:    Benzodiazepines POSITIVE (*)    All other components within normal limits  URINE CULTURE  ETHANOL  CBC WITH DIFFERENTIAL/PLATELET    EKG  EKG Interpretation  Date/Time:  Friday March 15 2017 13:10:36 EDT Ventricular Rate:  98 PR Interval:    QRS Duration: 78 QT Interval:  337 QTC Calculation: 431 R Axis:   73 Text Interpretation:  Sinus rhythm Low voltage, precordial leads Baseline wander in lead(s) V2 V3 No significant change since last tracing Confirmed by Winfred Leeds  MD, SAM [id number]) on 03/15/2017 1:17:29 PM        Radiology No results found.  Procedures Procedures (including critical care time)  Medications Ordered in ED Medications  cefTRIAXone (ROCEPHIN) 1 g in dextrose 5 % 50 mL IVPB (0 g Intravenous Stopped 03/15/17 1648)    Initial Impression / Assessment and Plan / ED Course  I have reviewed the triage vital signs and the nursing notes.  Pertinent labs & imaging results that were available during my care of the patient were reviewed by me and considered in my medical decision making (see chart for details).     51 year old female with acute confusion and hallucinations. NAD. VSS. Electrolytes and CBC unremarkable. UA with leukocyte esterase and nitrite positive. The patient has also been taking several OTC products for constipation and diarrhea. Symptoms are most likely related to mediation interactions. Consulted Psych who recommends a repeat telepysch evaluation tomorrow morning (4/7).   Final Clinical Impressions(s) / ED Diagnoses   Final diagnoses:  Lower urinary tract infectious disease  Confusion    New Prescriptions Discharge Medication List as of 03/16/2017 12:15 PM    START taking these medications   Details  cephALEXin (KEFLEX) 500 MG capsule Take 1 capsule (500 mg total) by mouth 4 (four) times daily., Starting Sat 03/16/2017, Print         Roshad Hack A Jasemine Nawaz, PA-C 03/16/17 2252    Noemi Chapel, MD 03/17/17 [phone]

## 2017-03-15 NOTE — ED Notes (Signed)
Pt AO X4 pt is c/o of of neck pain she was medicated per dr Weston Brass orders she was moved to bed #4 pt made comfortable. Pt has soft neck collar in place from where she had neck sx last week

## 2017-03-15 NOTE — ED Provider Notes (Signed)
The patient returns to the emergency department this time with a complaint of some hallucinations. According to the patient she feels like she is having some hallucinations, she has had some postoperative pain in her neck but states it is gradually improving, she took her brace off this morning to take a shower, the Steri-Strips came off and this made her worried as well.  On exam the patient is not responding to internal stimuli, she is not depressed or suicidal, she does not appear to be paranoid. I have removed the collar on her neck and taken down the dressing and though the Steri-Strips are gone there is no redness swelling or drainage of the wound. It is clean dry and intact. She is otherwise in no distress, she has clear heart and lung sounds, she appears to be awake and alert, the family members were the patient at the bedside.  We'll have psychiatry evaluate this patient as well.  The patient was reexamined at 12:00 noon on 03/16/2017, she is not having any significant hallucinations, she is not depressed or suicidal, she has been eating and drinking and tolerating fluids, she has family members here who will be with her to go home. She has been seen by mental health and cleared for discharge.  Keflex 4 times a day for 7 days, culture sent  Medical screening examination/treatment/procedure(s) were conducted as a shared visit with non-physician practitioner(s) and myself.  I personally evaluated the patient during the encounter.  Clinical Impression:   Final diagnoses:  Lower urinary tract infectious disease  Confusion         Noemi Chapel, MD 03/17/17 [phone]

## 2017-03-15 NOTE — ED Notes (Signed)
Denies SI

## 2017-03-15 NOTE — ED Notes (Addendum)
Pt placed on 2L 02NC for 02 sats as low as 77%.  Mia updated.  Pt says this happened before surgery due to anxiety. Pt took deep breaths with 02 and 02 sat increased to 98%.

## 2017-03-15 NOTE — ED Triage Notes (Addendum)
Pt reports that she had neck sx last week. Reports that she has been confused and hallucinating. Hearing the voice of her father and seeing him walking,. Doesn't know date and time and forgetting things.

## 2017-03-15 NOTE — ED Notes (Signed)
Dr. Sabra Heck assessed incision on anterior neck, no signs of infection, no dressing needed at this time per Dr. Sabra Heck.

## 2017-03-15 NOTE — ED Notes (Signed)
Pt given meal tray.

## 2017-03-15 NOTE — [facility] Counselor (Signed)
Writer updated EDP Sabra Heck on Priscille Loveless NP recommendation that pt not have inpatient treatment. TTS will do telepsych in am 03/16/17.  Arnold Long, [place] Therapeutic Triage Specialist

## 2017-03-16 DIAGNOSIS — R410 Disorientation, unspecified: Secondary | ICD-10-CM

## 2017-03-16 MED ORDER — CEPHALEXIN 500 MG PO CAPS: 500.0000 mg | ORAL_CAPSULE | Freq: Four times a day (QID) | ORAL | 0 refills | Status: DC

## 2017-03-16 NOTE — ED Notes (Signed)
Telepsych in process.  Mother at bedside

## 2017-03-16 NOTE — Discharge Instructions (Signed)
Return to the ER for severe or worsening symptoms including increasing pain, fever, vomiting or confusion.  Please have your doctor evaluate you within 48 hours for a recheck.  There is been a new medication prescribed called Keflex, take this once every 6 hours for the next 7 days. Take this with food. This will treat your urinary tract infection

## 2017-03-16 NOTE — Consult Note (Signed)
Telepsych Consultation   Reason for Consult:  Hallucinating  Referring Physician:  EDP  Patient Identification: Christy Russo MRN:  [id number] Principal Diagnosis: Confusion, postoperative Diagnosis:   Patient Active Problem List   Diagnosis Date Noted  . Confusion, postoperative [R41.0] 03/16/2017    Priority: High  . Post-operative state [Z98.890] 04/24/2016  . Depression, major, recurrent, moderate ([place]) [F33.1] 04/15/2016  . GAD (generalized anxiety disorder) [F41.1] 04/15/2016  . Benzodiazepine dependence, continuous ([place]) [F13.20] 04/15/2016  . Alcohol use disorder, moderate, dependence ([place]) [F10.20] 04/14/2016  . Hammertoe [M20.40] 03/06/2016    Total Time spent with patient: 30 minutes  Subjective:   Christy Russo is a 51 y.o. female patient admitted with confusion, states that she is ok.    HPI:  Christy Russo is a 51 y.o. female with history of MDD and anxiety disorder, patient admitted with confusion, states that she is ok.  Mitzi Davenport, patient's mother is in the hospital room with her.  Patient states that she had recent cervical fusion procedure wherein she was prescribed Tramadol.  She feels that perhaps she had a bad reaction to it.  She denies any adverse reaction form procedure.  She states that she is compliant in her Cymbalta, Buspar and Xanax (prescribed by Dr Gerarda Fraction, her PCP).  She was admitted to [facility] back in May 2017 for taking too much Xanax and drinking wine.   She vehemently denies any abuse of of her prescribed BZD.  Patient and her mother believe that her confusion was not medication induced but rather from a UTI, patient did have abnormal UTI and apparently was prescribed antibiotics by her PCP.  It was only after that UTI that patient became confused.    Today, patient is alert, oriented, denies SI HI, AVH, paranoia and would like to go home.  Patient's mother is vouching the fact that patient is back to old self.    Past Psychiatric  History: see HPI  Risk to Self: Suicidal Ideation: No Suicidal Intent: No Is patient at risk for suicide?: No Suicidal Plan?: No Access to Means: No What has been your use of drugs/alcohol within the last 12 months?: pt denies abuse  How many times?: 1 (overdose on etoh & xanax) Other Self Harm Risks: none Triggers for Past Attempts: Unpredictable Intentional Self Injurious Behavior: None Risk to Others: Homicidal Ideation: No Thoughts of Harm to Others: No Current Homicidal Intent: No Current Homicidal Plan: No Access to Homicidal Means: No Identified Victim: none History of harm to others?: No Assessment of Violence: None Noted Violent Behavior Description: pt denies hx violence Does patient have access to weapons?: No Criminal Charges Pending?: No Does patient have a court date: No Prior Inpatient Therapy: Prior Inpatient Therapy: Yes Prior Therapy Dates: Sept 2017 Prior Therapy Facilty/Provider(s): [facility] Reason for Treatment: suicide attempt, substance abuse Prior Outpatient Therapy: Prior Outpatient Therapy: Yes Prior Therapy Dates: in the past Prior Therapy Facilty/Provider(s): [facility]/ [place]  Reason for Treatment: therapy Does patient have an ACCT team?: No Does patient have Intensive In-House Services?  : No Does patient have [facility] services? : No Does patient have P4CC services?: No  Past Medical History:  Past Medical History:  Diagnosis Date  . Anemia   . Anxiety   . Depression   . Fibroids   . GERD (gastroesophageal reflux disease)   . Kidney stones   . Osteoarthritis   . Pinched nerve in neck     Past Surgical History:  Procedure  Laterality Date  .  neck sx 03/17/17    . ABDOMINAL HYSTERECTOMY    . c-section x 2    . CESAREAN SECTION     x 2  . NECK SURGERY    . TOE SURGERY Left 03/21/2016  . TUBAL LIGATION     Family History:  Family History  Problem Relation Age of Onset  . Congestive Heart Failure Maternal  Grandmother   . Heart disease Father   . Hypertension Mother   . Heart disease Mother   . Diabetes Mother    Family Psychiatric  History: see HPI Social History:  History  Alcohol Use No     History  Drug Use No    Social History   Social History  . Marital status: Divorced    Spouse name: N/A  . Number of children: N/A  . Years of education: N/A   Social History Main Topics  . Smoking status: Never Smoker  . Smokeless tobacco: Never Used  . Alcohol use No  . Drug use: No  . Sexual activity: Not Currently    Birth control/ protection: Surgical     Comment: hyst   Other Topics Concern  . None   Social History Narrative  . None   Additional Social History:    Allergies:   Allergies  Allergen Reactions  . Codeine Hives    Labs:  Results for orders placed or performed during the hospital encounter of 03/15/17 (from the past 48 hour(s))  Comprehensive metabolic panel     Status: Abnormal   Collection Time: 03/15/17  1:03 PM  Result Value Ref Range   Sodium 136 135 - 145 mmol/L   Potassium 3.7 3.5 - 5.1 mmol/L   Chloride 96 (L) 101 - 111 mmol/L   CO2 32 22 - 32 mmol/L   Glucose, Bld 135 (H) 65 - 99 mg/dL   BUN 11 6 - 20 mg/dL   Creatinine, Ser 0.72 0.44 - 1.00 mg/dL   Calcium 9.2 8.9 - 10.3 mg/dL   Total Protein 7.5 6.5 - 8.1 g/dL   Albumin 3.8 3.5 - 5.0 g/dL   AST 36 15 - 41 U/L   ALT 61 (H) 14 - 54 U/L   Alkaline Phosphatase 70 38 - 126 U/L   Total Bilirubin 1.0 0.3 - 1.2 mg/dL   GFR calc non Af Amer >60 >60 mL/min   GFR calc Af Amer >60 >60 mL/min    Comment: (NOTE) The eGFR has been calculated using the CKD EPI equation. This calculation has not been validated in all clinical situations. eGFR's persistently <60 mL/min signify possible Chronic Kidney Disease.    Anion gap 8 5 - 15  Ethanol     Status: None   Collection Time: 03/15/17  1:03 PM  Result Value Ref Range   Alcohol, Ethyl (B) <5 <5 mg/dL    Comment:        LOWEST DETECTABLE LIMIT  FOR SERUM ALCOHOL IS 5 mg/dL FOR MEDICAL PURPOSES ONLY   CBC with Diff     Status: None   Collection Time: 03/15/17  1:03 PM  Result Value Ref Range   WBC 9.0 4.0 - 10.5 K/uL   RBC 4.17 3.87 - 5.11 MIL/uL   Hemoglobin 12.5 12.0 - 15.0 g/dL   HCT 37.3 36.0 - 46.0 %   MCV 89.4 78.0 - 100.0 fL   MCH 30.0 26.0 - 34.0 pg   MCHC 33.5 30.0 - 36.0 g/dL   RDW 13.9 11.5 -  15.5 %   Platelets 316 150 - 400 K/uL   Neutrophils Relative % 55 %   Neutro Abs 5.0 1.7 - 7.7 K/uL   Lymphocytes Relative 28 %   Lymphs Abs 2.5 0.7 - 4.0 K/uL   Monocytes Relative 10 %   Monocytes Absolute 0.9 0.1 - 1.0 K/uL   Eosinophils Relative 6 %   Eosinophils Absolute 0.5 0.0 - 0.7 K/uL   Basophils Relative 1 %   Basophils Absolute 0.1 0.0 - 0.1 K/uL  Urinalysis, Routine w reflex microscopic     Status: Abnormal   Collection Time: 03/15/17  3:35 PM  Result Value Ref Range   Color, Urine YELLOW YELLOW   APPearance HAZY (A) CLEAR   Specific Gravity, Urine 1.004 (L) 1.005 - 1.030   pH 7.0 5.0 - 8.0   Glucose, UA NEGATIVE NEGATIVE mg/dL   Hgb urine dipstick NEGATIVE NEGATIVE   Bilirubin Urine NEGATIVE NEGATIVE   Ketones, ur NEGATIVE NEGATIVE mg/dL   Protein, ur NEGATIVE NEGATIVE mg/dL   Nitrite POSITIVE (A) NEGATIVE   Leukocytes, UA LARGE (A) NEGATIVE   RBC / HPF 0-5 0 - 5 RBC/hpf   WBC, UA 6-30 0 - 5 WBC/hpf   Bacteria, UA FEW (A) NONE SEEN   Squamous Epithelial / LPF 0-5 (A) NONE SEEN  Urine rapid drug screen (hosp performed)not at [facility]     Status: Abnormal   Collection Time: 03/15/17  3:35 PM  Result Value Ref Range   Opiates NONE DETECTED NONE DETECTED   Cocaine NONE DETECTED NONE DETECTED   Benzodiazepines POSITIVE (A) NONE DETECTED   Amphetamines NONE DETECTED NONE DETECTED   Tetrahydrocannabinol NONE DETECTED NONE DETECTED   Barbiturates NONE DETECTED NONE DETECTED    Comment:        DRUG SCREEN FOR MEDICAL PURPOSES ONLY.  IF CONFIRMATION IS NEEDED FOR ANY PURPOSE, NOTIFY LAB WITHIN 5 DAYS.         LOWEST DETECTABLE LIMITS FOR URINE DRUG SCREEN Drug Class       Cutoff (ng/mL) Amphetamine      1000 Barbiturate      200 Benzodiazepine   749 Tricyclics       449 Opiates          300 Cocaine          300 THC              50     Current Facility-Administered Medications  Medication Dose Route Frequency Provider Last Rate Last Dose  . traMADol (ULTRAM) tablet 50 mg  50 mg Oral Q4H PRN Merrily Pew, MD   50 mg at 03/15/17 2027   Current Outpatient Prescriptions  Medication Sig Dispense Refill  . acetaminophen (TYLENOL) 500 MG tablet Take 1,000 mg by mouth every 6 (six) hours as needed for moderate pain.    Marland Kitchen ALPRAZolam (XANAX) 1 MG tablet Take 1 mg by mouth 4 (four) times daily.   1  . busPIRone (BUSPAR) 5 MG tablet Take 5 mg by mouth 2 (two) times daily.   10  . diphenhydrAMINE (BENADRYL) 25 mg capsule Take 25 mg by mouth daily as needed for allergies.    . DULoxetine (CYMBALTA) 60 MG capsule Take 1 capsule (60 mg total) by mouth daily. For depression 30 capsule 0  . omeprazole (PRILOSEC) 20 MG capsule Take 1 capsule by mouth daily.  0  . traMADol (ULTRAM) 50 MG tablet Take 1-2 tablets by mouth 3 (three) times daily as needed for moderate pain.  0  . estradiol (ESTRACE) 1 MG tablet Take 1 tablet (1 mg total) by mouth daily. (Patient not taking: Reported on 02/23/2017) 30 tablet 12  . ondansetron (ZOFRAN) 4 MG tablet Take 1 tablet (4 mg total) by mouth every 6 (six) hours. 10 tablet 0    Musculoskeletal: Strength & Muscle Tone: within normal limits Gait & Station: normal Patient leans: N/A  Psychiatric Specialty Exam: Physical Exam  Nursing note and vitals reviewed.   ROS  Blood pressure 108/60, pulse 85, temperature 98.4 F (36.9 C), temperature source Oral, resp. rate 13, height '5\' 5"'$  (1.651 m), weight 68 kg (150 lb), SpO2 96 %.Body mass index is 24.96 kg/m.  General Appearance: Casual  Eye Contact:  Fair  Speech:  Clear and Coherent  Volume:  Normal  Mood:   Euthymic  Affect:  Appropriate and Congruent  Thought Process:  Coherent  Orientation:  Full (Time, Place, and Person)  Thought Content:  Logical  Suicidal Thoughts:  No  Homicidal Thoughts:  No  Memory:  Immediate;   Fair Recent;   Fair Remote;   Fair  Judgement:  Fair  Insight:  Fair  Psychomotor Activity:  Normal  Concentration:  Concentration: Good and Attention Span: Good  Recall:  Good  Fund of Knowledge:  Good  Language:  Good  Akathisia:  No  Handed:  Right  AIMS (if indicated):     Assets:  Communication Skills Housing Resilience Social Support  ADL's:  Intact  Cognition:  WNL  Sleep:  good    Treatment Plan Summary: Plan discharge  Disposition: No evidence of imminent risk to self or others at present.   Patient does not meet criteria for psychiatric inpatient admission. Discussed crisis plan, support from social network, calling 911, coming to the Emergency Department, and calling Suicide Hotline.  Janett Labella, NP [facility] 03/16/2017 10:49 AM    Agree with NP note

## 2017-03-18 LAB — URINE CULTURE: Culture: >=100000 — AB

## 2017-03-19 ENCOUNTER — Telehealth: Payer: Self-pay | Admitting: Emergency Medicine

## 2017-03-19 NOTE — Telephone Encounter (Signed)
Post ED Visit - Positive Culture Follow-up  Culture report reviewed by antimicrobial stewardship pharmacist:  []  Elenor Quinones, Pharm.D. []  Heide Guile, Pharm.D., BCPS AQ-ID []  Parks Neptune, Pharm.D., BCPS []  Alycia Rossetti, Pharm.D., BCPS []  [place], Pharm.D., BCPS, AAHIVP []  Legrand Como, Pharm.D., BCPS, AAHIVP []  Salome Arnt, PharmD, BCPS []  Dimitri Ped, PharmD, BCPS []  Vincenza Hews, PharmD, BCPS Maylon Cos PharmD  Positive urine culture Treated with cephalexin, organism sensitive to the same and no further patient follow-up is required at this time.  Hazle Nordmann 03/19/2017, 9:45 AM

## 2017-05-09 IMAGING — MR MR ANKLE*L* W/O CM
5 series · 40 of 40 positions shown · non-contrast
Comparison: Plain films left foot 07/02/2016 from [REDACTED].

CLINICAL DATA: Left heel pain since an Achilles tendon injury due
to a fall in a hole in August 2015.

EXAM:
MRI OF THE LEFT ANKLE WITHOUT CONTRAST
TECHNIQUE: Multiplanar, multisequence MR imaging of the ankle was performed. No
intravenous contrast was administered.

[Series 4: PD fat-sat · axial · 3.0mm · 0.56mm/px · z∈[-81,+32]mm · 9 of 30 slices shown]
[im 1/30]
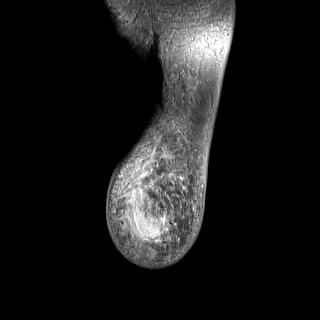
[im 4/30]
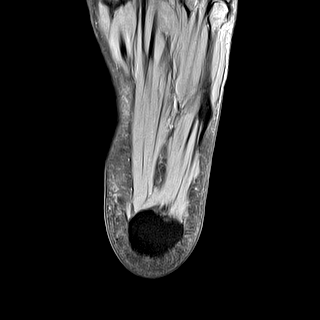
[im 8/30]
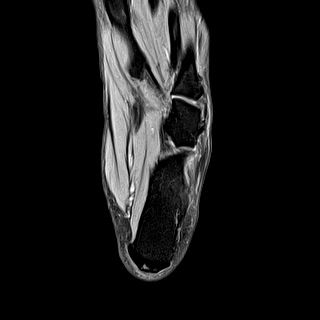
[im 11/30]
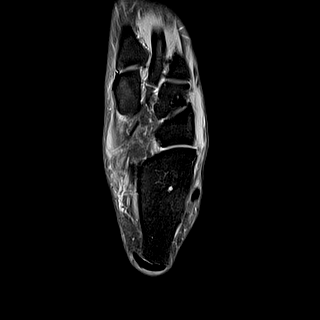
[im 15/30]
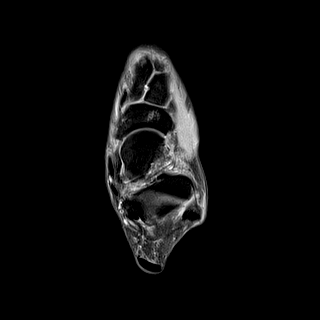
[im 19/30]
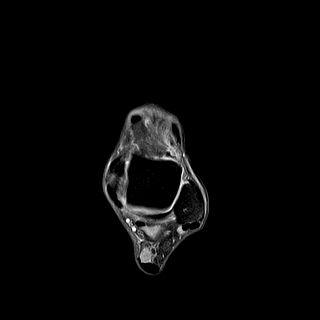
[im 22/30]
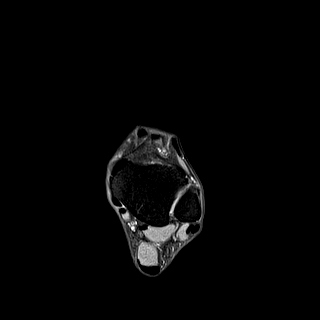
[im 26/30]
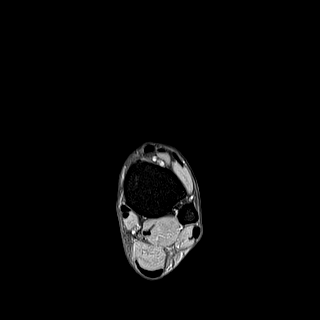
[im 30/30]
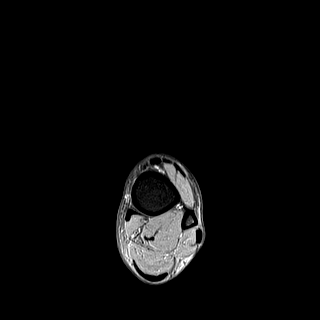

[Series 5: T1 · sagittal · 3.0mm · 0.56mm/px · 7 of 22 slices shown]
[im 1/22]
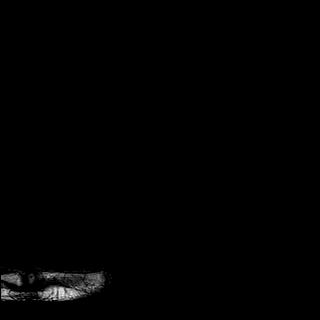
[im 4/22]
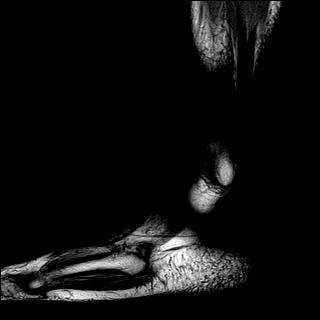
[im 8/22]
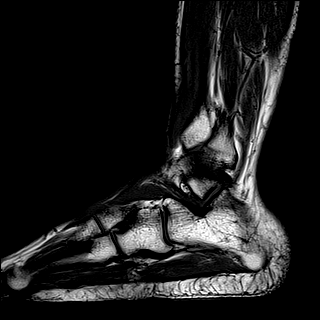
[im 11/22]
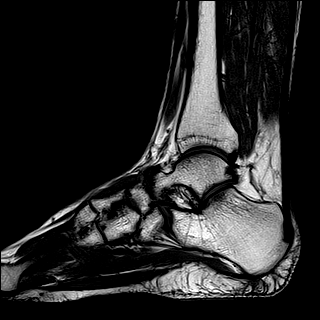
[im 15/22]
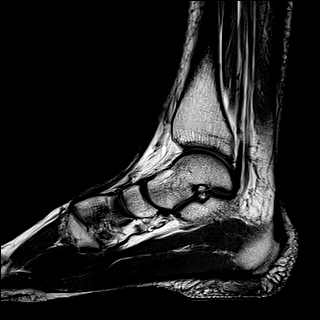
[im 18/22]
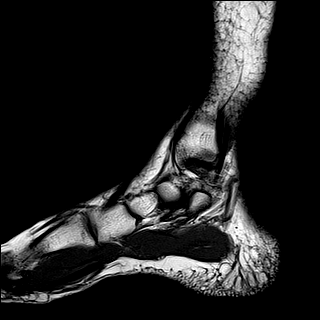
[im 22/22]
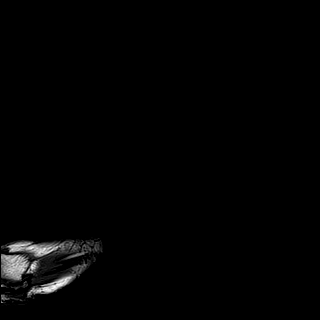

[Series 6: T2 fat-sat · sagittal · 3.0mm · 0.56mm/px · 7 of 22 slices shown (1 of 3)]
[im 1/22]
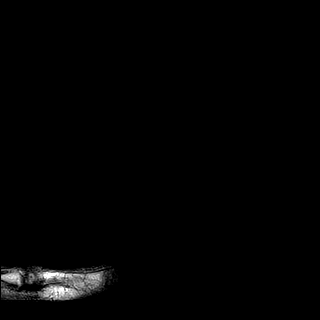
[im 4/22]
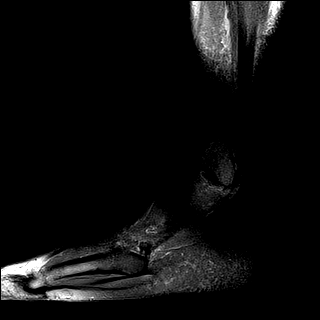
[im 8/22]
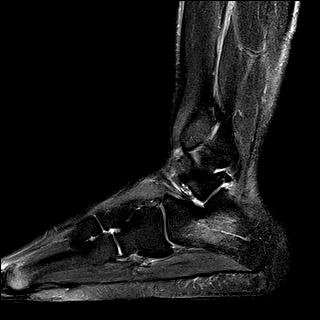
[im 11/22]
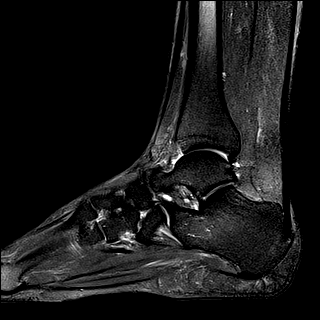
[im 15/22]
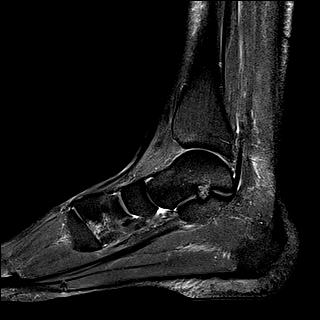
[im 18/22]
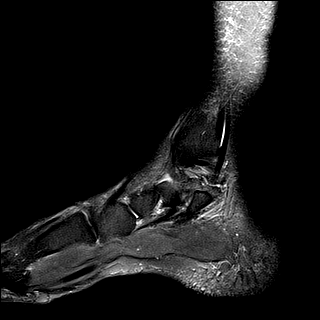
[im 22/22]
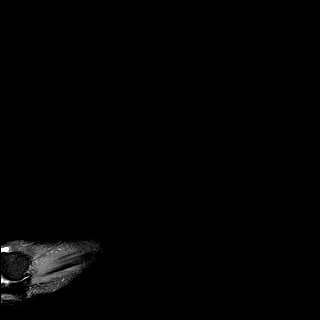

[Series 7: T2 fat-sat · coronal · 3.5mm · 0.56mm/px · 8 of 28 slices shown (2 of 3)]
[im 1/28]
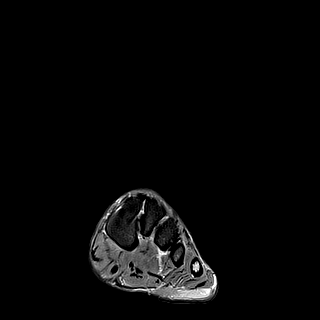
[im 4/28]
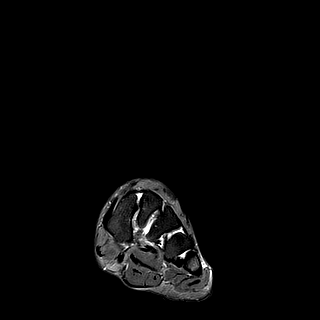
[im 8/28]
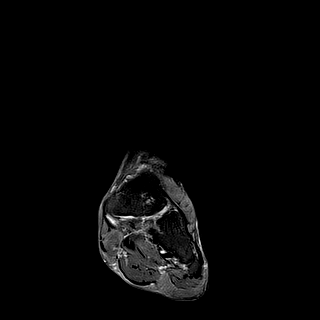
[im 12/28]
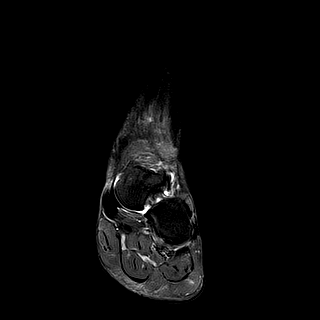
[im 16/28]
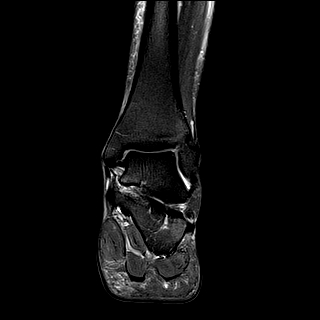
[im 20/28]
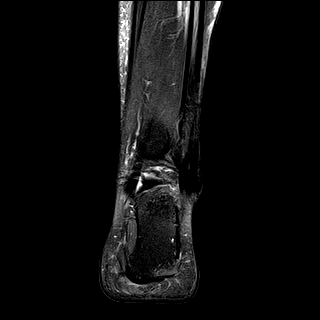
[im 24/28]
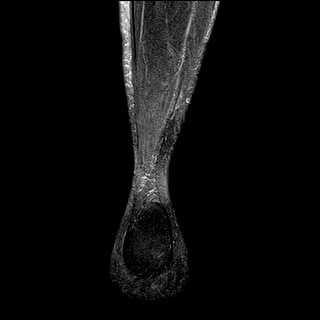
[im 28/28]
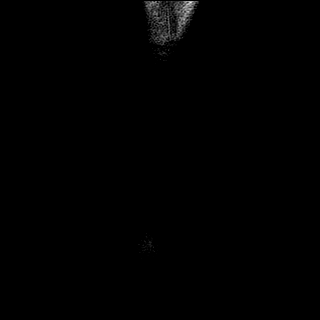

[Series 11: T2 fat-sat · axial · 3.0mm · 0.56mm/px · z∈[-52,+61]mm · 9 of 30 slices shown (3 of 3)]
[im 1/30]
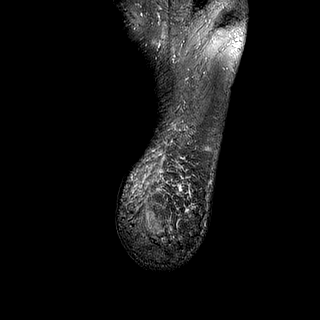
[im 4/30]
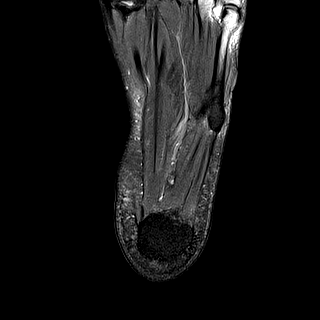
[im 8/30]
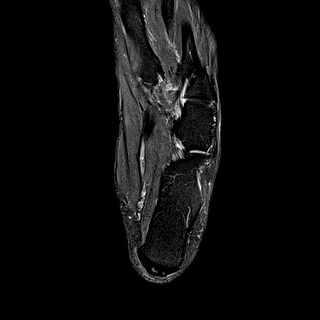
[im 11/30]
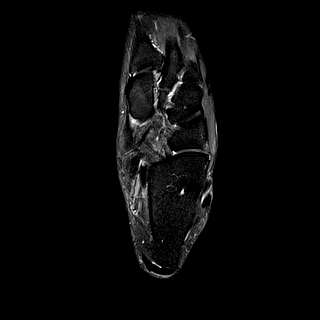
[im 15/30]
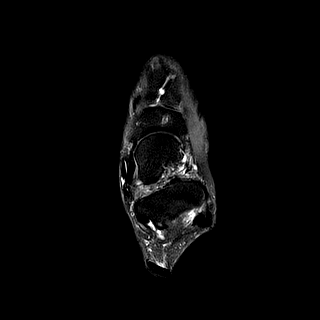
[im 19/30]
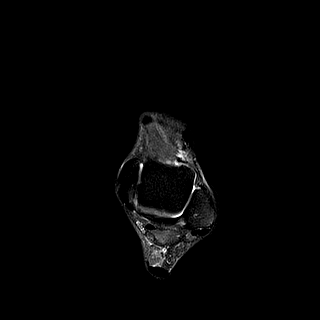
[im 22/30]
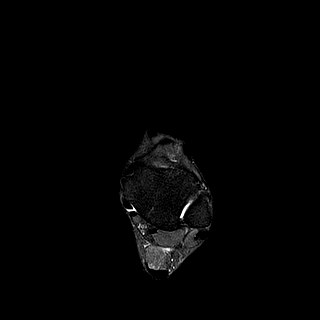
[im 26/30]
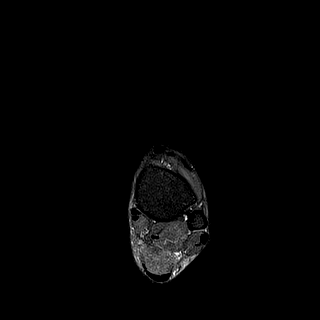
[im 30/30]
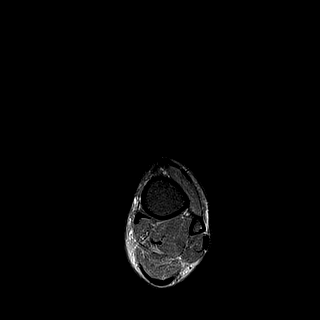

[40 of 40 positions shown; findings below may reference images not displayed]

FINDINGS: TENDONS

Peroneal: Intact and normal in appearance.

Posteromedial: Intact and normal in appearance.

Anterior: Intact and normal in appearance.

Achilles: Intact and normal in appearance.

Plantar Fascia: Appears normal.

LIGAMENTS

Lateral: Intact.

Medial: Intact.

CARTILAGE

Ankle Joint: Unremarkable.

Subtalar Joints/Sinus Tarsi: Unremarkable.

Bones: No fracture or worrisome marrow lesion. Mild spurring at the
Achilles tendon insertion on the calcaneus is noted. Subchondral
cyst formation in the navicular at its articulation with the middle
cuneiform is noted.

Other: None.
IMPRESSION: Negative for tendon or ligament tear.  The Achilles appears normal.

Mild osteoarthritis at the articulation of the navicular and middle
cuneiform.

## 2017-06-11 ENCOUNTER — Telehealth: Payer: Self-pay

## 2017-06-11 NOTE — Telephone Encounter (Signed)
Pt left Vm that she is ready to schedule colonoscopy. [phone].

## 2017-06-13 ENCOUNTER — Telehealth: Payer: Self-pay

## 2017-06-13 NOTE — Telephone Encounter (Signed)
See separate triage.  

## 2017-07-03 NOTE — Telephone Encounter (Signed)
Waiting on a call from the pt to see if she has ever had any complications with anesthesia, so I can complete the triage.

## 2017-07-11 NOTE — Telephone Encounter (Signed)
Pt has been scheduled OV for 08/30/2017 at 11:00 Am with Neil Crouch, PA. She has been taken off of the schedule for 08/07/2017 for the colonoscopy.

## 2017-07-11 NOTE — Telephone Encounter (Signed)
LMOM to call, I need to know if she has ever had any problems with anesthesia.

## 2017-07-11 NOTE — Telephone Encounter (Signed)
Bring in. Likely will need Propofol.

## 2017-07-11 NOTE — Telephone Encounter (Signed)
Gastroenterology Pre-Procedure Review  Request Date: 06/13/2017 Requesting Physician: Dr. Gerarda Fraction  PATIENT REVIEW QUESTIONS: The patient responded to the following health history questions as indicated:    1. Diabetes Melitis: NO 2. Joint replacements in the past 12 months: no 3. Major health problems in the past 3 months: no 4. Has an artificial valve or MVP: no 5. Has a defibrillator: no 6. Has been advised in past to take antibiotics in advance of a procedure like teeth cleaning: no 7. Family history of colon cancer: no  8. Alcohol Use: RARELY 9. History of sleep apnea: no  10. History of coronary artery or other vascular stents placed within the last 12 months: no 11. History of any prior anesthesia complications: no    MEDICATIONS & ALLERGIES:    Patient reports the following regarding taking any blood thinners:   Plavix? no Aspirin? no Coumadin? no Brilinta? no Xarelto? no Eliquis? no Pradaxa? no Savaysa? no Effient? no  Patient confirms/reports the following medications:  Current Outpatient Prescriptions  Medication Sig Dispense Refill  . acetaminophen (TYLENOL) 500 MG tablet Take 1,000 mg by mouth every 6 (six) hours as needed for moderate pain.    Marland Kitchen ALPRAZolam (XANAX) 1 MG tablet Take 1 mg by mouth 4 (four) times daily. PT said she takes only once or twice a week  1  . DULoxetine (CYMBALTA) 60 MG capsule Take 1 capsule (60 mg total) by mouth daily. For depression 30 capsule 0  . omeprazole (PRILOSEC) 20 MG capsule Take 1 capsule by mouth daily.  0  . cephALEXin (KEFLEX) 500 MG capsule Take 1 capsule (500 mg total) by mouth 4 (four) times daily. (Patient not taking: Reported on 06/13/2017) 28 capsule 0  . diphenhydrAMINE (BENADRYL) 25 mg capsule Take 25 mg by mouth daily as needed for allergies.    Marland Kitchen estradiol (ESTRACE) 1 MG tablet Take 1 tablet (1 mg total) by mouth daily. (Patient not taking: Reported on 02/23/2017) 30 tablet 12  . ondansetron (ZOFRAN) 4 MG tablet Take 1  tablet (4 mg total) by mouth every 6 (six) hours. (Patient not taking: Reported on 06/13/2017) 10 tablet 0   No current facility-administered medications for this visit.     Patient confirms/reports the following allergies:  Allergies  Allergen Reactions  . Codeine Hives    No orders of the defined types were placed in this encounter.   AUTHORIZATION INFORMATION Primary Insurance:  ID #:   Group #:  Pre-Cert / Auth required:  Pre-Cert / Auth #:   Secondary Insurance: ID #:   Group #:  Pre-Cert / Auth required: Pre-Cert / Auth #:   SCHEDULE INFORMATION: Procedure has been scheduled as follows:  Date:  08/07/2017           Time: 12:15 pm  Location: [facility] Short Stay  This Gastroenterology Pre-Precedure Review Form is being routed to the following provider(s): R. Garfield Cornea, MD

## 2017-08-30 ENCOUNTER — Encounter: Payer: Self-pay | Admitting: Gastroenterology

## 2017-08-30 ENCOUNTER — Telehealth: Payer: Self-pay | Admitting: Gastroenterology

## 2017-08-30 ENCOUNTER — Ambulatory Visit: Payer: BLUE CROSS/BLUE SHIELD | Admitting: Gastroenterology

## 2017-08-30 NOTE — Telephone Encounter (Signed)
Patient was a no show and letter sent  °

## 2018-01-06 ENCOUNTER — Encounter: Payer: Self-pay | Admitting: Internal Medicine

## 2018-01-14 ENCOUNTER — Encounter: Payer: Self-pay | Admitting: General Surgery

## 2018-01-14 ENCOUNTER — Ambulatory Visit (INDEPENDENT_AMBULATORY_CARE_PROVIDER_SITE_OTHER): Payer: BLUE CROSS/BLUE SHIELD | Admitting: General Surgery

## 2018-01-14 VITALS — BP 163/91 | HR 76 | Temp 96.9°F | Resp 18 | Ht 64.0 in | Wt 168.0 lb

## 2018-01-14 DIAGNOSIS — K8020 Calculus of gallbladder without cholecystitis without obstruction: Secondary | ICD-10-CM | POA: Diagnosis not present

## 2018-01-14 NOTE — Progress Notes (Signed)
[facility] History and Physical  Reason for Referral:Galbladder  Referring Physician: Dr. Gerarda Fraction   Chief Complaint    Abdominal Pain      Christy Russo is a 53 y.o. female.  HPI: Christy Russo is a very pleasant 52 yo with a history of RUQ pain with associated nausea/vomiting and bloating.  She says that this is worse with greasy foods, and she has noticed that the symptoms having been getting worse in the last few months. She cannot tell me exactly when they started.  She also reports some diarrhea, but otherwise is relatively healthy, and has undergone a c-section and hysterectomy and tubal ligation.    Past Medical History:  Diagnosis Date  . Anemia   . Anxiety   . Depression   . Fibroids   . GERD (gastroesophageal reflux disease)   . Kidney stones   . Osteoarthritis   . Pinched nerve in neck     Past Surgical History:  Procedure Laterality Date  .  neck sx 03/17/17    . ABDOMINAL HYSTERECTOMY    . c-section x 2    . CESAREAN SECTION     x 2  . NECK SURGERY    . TOE SURGERY Left 03/21/2016  . TUBAL LIGATION      Family History  Problem Relation Age of Onset  . Congestive Heart Failure Maternal Grandmother   . Heart disease Father   . Hypertension Mother   . Heart disease Mother   . Diabetes Mother     Social History   Tobacco Use  . Smoking status: Never Smoker  . Smokeless tobacco: Never Used  Substance Use Topics  . Alcohol use: Yes    Alcohol/week: 1.2 oz    Types: 2 Glasses of wine per week  . Drug use: No    Medications: I have reviewed the patient's current medications. Allergies as of 01/14/2018      Reactions   Codeine Hives      Medication List        Accurate as of 01/14/18 10:38 AM. Always use your most recent med list.          acetaminophen 500 MG tablet Commonly known as:  TYLENOL Take 1,000 mg by mouth every 6 (six) hours as needed for moderate pain.   ALPRAZolam 1 MG tablet Commonly known as:   XANAX Take 1 mg by mouth 4 (four) times daily. PT said she takes only once or twice a week   cephALEXin 500 MG capsule Commonly known as:  KEFLEX Take 1 capsule (500 mg total) by mouth 4 (four) times daily.   diphenhydrAMINE 25 mg capsule Commonly known as:  BENADRYL Take 25 mg by mouth daily as needed for allergies.   DULoxetine 60 MG capsule Commonly known as:  CYMBALTA Take 1 capsule (60 mg total) by mouth daily. For depression   estradiol 1 MG tablet Commonly known as:  ESTRACE Take 1 tablet (1 mg total) by mouth daily.   omeprazole 20 MG capsule Commonly known as:  PRILOSEC Take 1 capsule by mouth daily.   ondansetron 4 MG tablet Commonly known as:  ZOFRAN Take 1 tablet (4 mg total) by mouth every 6 (six) hours.        ROS:  A comprehensive review of systems was negative except for: Gastrointestinal: positive for abdominal pain, nausea, reflux symptoms and vomiting Musculoskeletal: positive for back pain, bone pain, neck pain and shoulder pain tired, dizzy spells  Blood pressure Marland Kitchen)  163/91, pulse 76, temperature (!) 96.9 F (36.1 C), resp. rate 18, height '5\' 4"'$  (1.626 m), weight 168 lb (76.2 kg). Physical Exam  Constitutional: She is oriented to person, place, and time and well-developed, well-nourished, and in no distress.  HENT:  Head: Normocephalic.  Eyes: Pupils are equal, round, and reactive to light.  Neck: Normal range of motion.  Cardiovascular: Normal rate and regular rhythm.  Pulmonary/Chest: Effort normal and breath sounds normal.  Abdominal: Soft. She exhibits no distension.  Some mild RUQ pain with deep palpation  Musculoskeletal: Normal range of motion.  Neurological: She is alert and oriented to person, place, and time.  Skin: Skin is warm and dry.  Psychiatric: Mood, memory, affect and judgment normal.  Vitals reviewed.   Results: OSH labs and [place] -labs with elevated Alk phos and slightly elevated AST/ ALT, normal T bili -[place] with stones,  Normal CBD           Assessment & Plan:  Christy Russo is a 52 y.o. female with symptomatic cholelithiasis who also has some elevated LFTs but normal CBD at 44m.  - OR for laparoscopic cholecystectomy possible open  PLAN: I counseled the patient about the indication, risks and benefits of laparoscopic cholecystectomy.  She understands there is a very small chance for bleeding, infection, injury to normal structures (including common bile duct), conversion to open surgery, persistent symptoms, evolution of postcholecystectomy diarrhea, need for secondary interventions, anesthesia reaction, cardiopulmonary issues and other risks not specifically detailed here. I described the expected recovery, the plan for follow-up and the restrictions during the recovery phase.  All questions were answered.    LVirl Cagey2/04/2018, 10:38 AM

## 2018-01-14 NOTE — Patient Instructions (Signed)
Laparoscopic Cholecystectomy Laparoscopic cholecystectomy is surgery to remove the gallbladder. The gallbladder is a pear-shaped organ that lies beneath the liver on the right side of the body. The gallbladder stores bile, which is a fluid that helps the body to digest fats. Cholecystectomy is often done for inflammation of the gallbladder (cholecystitis). This condition is usually caused by a buildup of gallstones (cholelithiasis) in the gallbladder. Gallstones can block the flow of bile, which can result in inflammation and pain. In severe cases, emergency surgery may be required. This procedure is done though small incisions in your abdomen (laparoscopic surgery). A thin scope with a camera (laparoscope) is inserted through one incision. Thin surgical instruments are inserted through the other incisions. In some cases, a laparoscopic procedure may be turned into a type of surgery that is done through a larger incision (open surgery). Tell a health care provider about:  Any allergies you have.  All medicines you are taking, including vitamins, herbs, eye drops, creams, and over-the-counter medicines.  Any problems you or family members have had with anesthetic medicines.  Any blood disorders you have.  Any surgeries you have had.  Any medical conditions you have.  Whether you are pregnant or may be pregnant. What are the risks? Generally, this is a safe procedure. However, problems may occur, including:  Infection.  Bleeding.  Allergic reactions to medicines.  Damage to other structures or organs.  A stone remaining in the common bile duct. The common bile duct carries bile from the gallbladder into the small intestine.  A bile leak from the cyst duct that is clipped when your gallbladder is removed.  What happens before the procedure? Staying hydrated Follow instructions from your health care provider about hydration, which may include:  Up to 2 hours before the procedure -  you may continue to drink clear liquids, such as water, clear fruit juice, black coffee, and plain tea.  Eating and drinking restrictions Follow instructions from your health care provider about eating and drinking, which may include:  8 hours before the procedure - stop eating heavy meals or foods such as meat, fried foods, or fatty foods.  6 hours before the procedure - stop eating light meals or foods, such as toast or cereal.  6 hours before the procedure - stop drinking milk or drinks that contain milk.  2 hours before the procedure - stop drinking clear liquids.  Medicines  Ask your health care provider about: ? Changing or stopping your regular medicines. This is especially important if you are taking diabetes medicines or blood thinners. ? Taking medicines such as aspirin and ibuprofen. These medicines can thin your blood. Do not take these medicines before your procedure if your health care provider instructs you not to.  You may be given antibiotic medicine to help prevent infection. General instructions  Let your health care provider know if you develop a cold or an infection before surgery.  Plan to have someone take you home from the hospital or clinic.  Ask your health care provider how your surgical site will be marked or identified. What happens during the procedure?  To reduce your risk of infection: ? Your health care team will wash or sanitize their hands. ? Your skin will be washed with soap. ? Hair may be removed from the surgical area.  An IV tube may be inserted into one of your veins.  You will be given one or more of the following: ? A medicine to help you relax (sedative). ?  A medicine to make you fall asleep (general anesthetic).  A breathing tube will be placed in your mouth.  Your surgeon will make several small cuts (incisions) in your abdomen.  The laparoscope will be inserted through one of the small incisions. The camera on the laparoscope  will send images to a TV screen (monitor) in the operating room. This lets your surgeon see inside your abdomen.  Air-like gas will be pumped into your abdomen. This will expand your abdomen to give the surgeon more room to perform the surgery.  Other tools that are needed for the procedure will be inserted through the other incisions. The gallbladder will be removed through one of the incisions.  Your common bile duct may be examined. If stones are found in the common bile duct, they may be removed.  After your gallbladder has been removed, the incisions will be closed with stitches (sutures), staples, or skin glue.  Your incisions may be covered with a bandage (dressing). The procedure may vary among health care providers and hospitals. What happens after the procedure?  Your blood pressure, heart rate, breathing rate, and blood oxygen level will be monitored until the medicines you were given have worn off.  You will be given medicines as needed to control your pain.  Do not drive for 24 hours if you were given a sedative. This information is not intended to replace advice given to you by your health care provider. Make sure you discuss any questions you have with your health care provider. Document Released: 11/26/2005 Document Revised: 06/17/2016 Document Reviewed: 05/14/2016 Elsevier Interactive Patient Education  2018 [employer]. Cholelithiasis Cholelithiasis is also called "gallstones." It is a kind of gallbladder disease. The gallbladder is an organ that stores a liquid (bile) that helps you digest fat. Gallstones may not cause symptoms (may be silent gallstones) until they cause a blockage, and then they can cause pain (gallbladder attack). Follow these instructions at home:  Take over-the-counter and prescription medicines only as told by your doctor.  Stay at a healthy weight.  Eat healthy foods. This includes: ? Eating fewer fatty foods, like fried foods. ? Eating  fewer refined carbs (refined carbohydrates). Refined carbs are breads and grains that are highly processed, like white bread and white rice. Instead, choose whole grains like whole-wheat bread and brown rice. ? Eating more fiber. Almonds, fresh fruit, and beans are healthy sources of fiber.  Keep all follow-up visits as told by your doctor. This is important. Contact a doctor if:  You have sudden pain in the upper right side of your belly (abdomen). Pain might spread to your right shoulder or your chest. This may be a sign of a gallbladder attack.  You feel sick to your stomach (are nauseous).  You throw up (vomit).  You have been diagnosed with gallstones that have no symptoms and you get: ? Belly pain. ? Discomfort, burning, or fullness in the upper part of your belly (indigestion). Get help right away if:  You have sudden pain in the upper right side of your belly, and it lasts for more than 2 hours.  You have belly pain that lasts for more than 5 hours.  You have a fever or chills.  You keep feeling sick to your stomach or you keep throwing up.  Your skin or the whites of your eyes turn yellow (jaundice).  You have dark-colored pee (urine).  You have light-colored poop (stool). Summary  Cholelithiasis is also called "gallstones."  The gallbladder  is an organ that stores a liquid (bile) that helps you digest fat.  Silent gallstones are gallstones that do not cause symptoms.  A gallbladder attack may cause sudden pain in the upper right side of your belly. Pain might spread to your right shoulder or your chest. If this happens, contact your doctor.  If you have sudden pain in the upper right side of your belly that lasts for more than 2 hours, get help right away. This information is not intended to replace advice given to you by your health care provider. Make sure you discuss any questions you have with your health care provider. Document Released: 05/14/2008 Document  Revised: 08/12/2016 Document Reviewed: 08/12/2016 Elsevier Interactive Patient Education  2017 [employer].

## 2018-01-16 NOTE — H&P (Signed)
[facility] History and Physical  Reason for Referral:Galbladder  Referring Physician: Dr. Gerarda Fraction   Chief Complaint    Abdominal Pain      Christy Russo is a 52 y.o. female.  HPI: Christy Russo is a very pleasant 52 yo with a history of RUQ pain with associated nausea/vomiting and bloating.  She says that this is worse with greasy foods, and she has noticed that the symptoms having been getting worse in the last few months. She cannot tell me exactly when they started.  She also reports some diarrhea, but otherwise is relatively healthy, and has undergone a c-section and hysterectomy and tubal ligation.    Past Medical History:  Diagnosis Date  . Anemia   . Anxiety   . Depression   . Fibroids   . GERD (gastroesophageal reflux disease)   . Kidney stones   . Osteoarthritis   . Pinched nerve in neck     Past Surgical History:  Procedure Laterality Date  .  neck sx 03/17/17    . ABDOMINAL HYSTERECTOMY    . c-section x 2    . CESAREAN SECTION     x 2  . NECK SURGERY    . TOE SURGERY Left 03/21/2016  . TUBAL LIGATION      Family History  Problem Relation Age of Onset  . Congestive Heart Failure Maternal Grandmother   . Heart disease Father   . Hypertension Mother   . Heart disease Mother   . Diabetes Mother     Social History   Tobacco Use  . Smoking status: Never Smoker  . Smokeless tobacco: Never Used  Substance Use Topics  . Alcohol use: Yes    Alcohol/week: 1.2 oz    Types: 2 Glasses of wine per week  . Drug use: No    Medications: I have reviewed the patient's current medications. Allergies as of 01/14/2018      Reactions   Codeine Hives      Medication List        Accurate as of 01/14/18 10:38 AM. Always use your most recent med list.          acetaminophen 500 MG tablet Commonly known as:  TYLENOL Take 1,000 mg by mouth every 6 (six) hours as needed for moderate pain.   ALPRAZolam 1 MG tablet Commonly known as:   XANAX Take 1 mg by mouth 4 (four) times daily. PT said she takes only once or twice a week   cephALEXin 500 MG capsule Commonly known as:  KEFLEX Take 1 capsule (500 mg total) by mouth 4 (four) times daily.   diphenhydrAMINE 25 mg capsule Commonly known as:  BENADRYL Take 25 mg by mouth daily as needed for allergies.   DULoxetine 60 MG capsule Commonly known as:  CYMBALTA Take 1 capsule (60 mg total) by mouth daily. For depression   estradiol 1 MG tablet Commonly known as:  ESTRACE Take 1 tablet (1 mg total) by mouth daily.   omeprazole 20 MG capsule Commonly known as:  PRILOSEC Take 1 capsule by mouth daily.   ondansetron 4 MG tablet Commonly known as:  ZOFRAN Take 1 tablet (4 mg total) by mouth every 6 (six) hours.        ROS:  A comprehensive review of systems was negative except for: Gastrointestinal: positive for abdominal pain, nausea, reflux symptoms and vomiting Musculoskeletal: positive for back pain, bone pain, neck pain and shoulder pain tired, dizzy spells  Blood pressure Marland Kitchen)  163/91, pulse 76, temperature (!) 96.9 F (36.1 C), resp. rate 18, height '5\' 4"'$  (1.626 m), weight 168 lb (76.2 kg). Physical Exam  Constitutional: She is oriented to person, place, and time and well-developed, well-nourished, and in no distress.  HENT:  Head: Normocephalic.  Eyes: Pupils are equal, round, and reactive to light.  Neck: Normal range of motion.  Cardiovascular: Normal rate and regular rhythm.  Pulmonary/Chest: Effort normal and breath sounds normal.  Abdominal: Soft. She exhibits no distension.  Some mild RUQ pain with deep palpation  Musculoskeletal: Normal range of motion.  Neurological: She is alert and oriented to person, place, and time.  Skin: Skin is warm and dry.  Psychiatric: Mood, memory, affect and judgment normal.  Vitals reviewed.   Results: OSH labs and [place] -labs with elevated Alk phos and slightly elevated AST/ ALT, normal T bili -[place] with stones,  Normal CBD           Assessment & Plan:  Christy Russo is a 52 y.o. female with symptomatic cholelithiasis who also has some elevated LFTs but normal CBD at 24m.  - OR for laparoscopic cholecystectomy possible open  PLAN: I counseled the patient about the indication, risks and benefits of laparoscopic cholecystectomy.  She understands there is a very small chance for bleeding, infection, injury to normal structures (including common bile duct), conversion to open surgery, persistent symptoms, evolution of postcholecystectomy diarrhea, need for secondary interventions, anesthesia reaction, cardiopulmonary issues and other risks not specifically detailed here. I described the expected recovery, the plan for follow-up and the restrictions during the recovery phase.  All questions were answered.    LVirl Cagey2/04/2018, 10:38 AM

## 2018-01-24 NOTE — Patient Instructions (Signed)
Christy Russo  01/24/2018     @PREFPERIOPPHARMACY @   Your procedure is scheduled on  01/31/2018  Report to [facility] at  615   A.M.  Call this number if you have problems the morning of surgery:  [phone]   Remember:  Do not eat food or drink liquids after midnight.  Take these medicines the morning of surgery with A SIP OF WATER  Xanax, cymbalta, prilosec, zofran.   Do not wear jewelry, make-up or nail polish.  Do not wear lotions, powders, or perfumes, or deodorant.  Do not shave 48 hours prior to surgery.  Men may shave face and neck.  Do not bring valuables to the hospital.  [facility] is not responsible for any belongings or valuables.  Contacts, dentures or bridgework may not be worn into surgery.  Leave your suitcase in the car.  After surgery it may be brought to your room.  For patients admitted to the hospital, discharge time will be determined by your treatment team.  Patients discharged the day of surgery will not be allowed to drive home.   Name and phone number of your driver:   family Special instructions:  None  Please read over the following fact sheets that you were given. Anesthesia Post-op Instructions and Care and Recovery After Surgery       Laparoscopic Cholecystectomy Laparoscopic cholecystectomy is surgery to remove the gallbladder. The gallbladder is a pear-shaped organ that lies beneath the liver on the right side of the body. The gallbladder stores bile, which is a fluid that helps the body to digest fats. Cholecystectomy is often done for inflammation of the gallbladder (cholecystitis). This condition is usually caused by a buildup of gallstones (cholelithiasis) in the gallbladder. Gallstones can block the flow of bile, which can result in inflammation and pain. In severe cases, emergency surgery may be required. This procedure is done though small incisions in your abdomen (laparoscopic surgery). A thin scope with a camera  (laparoscope) is inserted through one incision. Thin surgical instruments are inserted through the other incisions. In some cases, a laparoscopic procedure may be turned into a type of surgery that is done through a larger incision (open surgery). Tell a health care provider about:  Any allergies you have.  All medicines you are taking, including vitamins, herbs, eye drops, creams, and over-the-counter medicines.  Any problems you or family members have had with anesthetic medicines.  Any blood disorders you have.  Any surgeries you have had.  Any medical conditions you have.  Whether you are pregnant or may be pregnant. What are the risks? Generally, this is a safe procedure. However, problems may occur, including:  Infection.  Bleeding.  Allergic reactions to medicines.  Damage to other structures or organs.  A stone remaining in the common bile duct. The common bile duct carries bile from the gallbladder into the small intestine.  A bile leak from the cyst duct that is clipped when your gallbladder is removed.  What happens before the procedure? Staying hydrated Follow instructions from your health care provider about hydration, which may include:  Up to 2 hours before the procedure - you may continue to drink clear liquids, such as water, clear fruit juice, black coffee, and plain tea.  Eating and drinking restrictions Follow instructions from your health care provider about eating and drinking, which may include:  8 hours before the procedure - stop eating heavy meals or foods such as  meat, fried foods, or fatty foods.  6 hours before the procedure - stop eating light meals or foods, such as toast or cereal.  6 hours before the procedure - stop drinking milk or drinks that contain milk.  2 hours before the procedure - stop drinking clear liquids.  Medicines  Ask your health care provider about: ? Changing or stopping your regular medicines. This is especially  important if you are taking diabetes medicines or blood thinners. ? Taking medicines such as aspirin and ibuprofen. These medicines can thin your blood. Do not take these medicines before your procedure if your health care provider instructs you not to.  You may be given antibiotic medicine to help prevent infection. General instructions  Let your health care provider know if you develop a cold or an infection before surgery.  Plan to have someone take you home from the hospital or clinic.  Ask your health care provider how your surgical site will be marked or identified. What happens during the procedure?  To reduce your risk of infection: ? Your health care team will wash or sanitize their hands. ? Your skin will be washed with soap. ? Hair may be removed from the surgical area.  An IV tube may be inserted into one of your veins.  You will be given one or more of the following: ? A medicine to help you relax (sedative). ? A medicine to make you fall asleep (general anesthetic).  A breathing tube will be placed in your mouth.  Your surgeon will make several small cuts (incisions) in your abdomen.  The laparoscope will be inserted through one of the small incisions. The camera on the laparoscope will send images to a TV screen (monitor) in the operating room. This lets your surgeon see inside your abdomen.  Air-like gas will be pumped into your abdomen. This will expand your abdomen to give the surgeon more room to perform the surgery.  Other tools that are needed for the procedure will be inserted through the other incisions. The gallbladder will be removed through one of the incisions.  Your common bile duct may be examined. If stones are found in the common bile duct, they may be removed.  After your gallbladder has been removed, the incisions will be closed with stitches (sutures), staples, or skin glue.  Your incisions may be covered with a bandage (dressing). The  procedure may vary among health care providers and hospitals. What happens after the procedure?  Your blood pressure, heart rate, breathing rate, and blood oxygen level will be monitored until the medicines you were given have worn off.  You will be given medicines as needed to control your pain.  Do not drive for 24 hours if you were given a sedative. This information is not intended to replace advice given to you by your health care provider. Make sure you discuss any questions you have with your health care provider. Document Released: 11/26/2005 Document Revised: 06/17/2016 Document Reviewed: 05/14/2016 Elsevier Interactive Patient Education  2018 [employer].  Laparoscopic Cholecystectomy, Care After This sheet gives you information about how to care for yourself after your procedure. Your health care provider may also give you more specific instructions. If you have problems or questions, contact your health care provider. What can I expect after the procedure? After the procedure, it is common to have:  Pain at your incision sites. You will be given medicines to control this pain.  Mild nausea or vomiting.  Bloating and possible  shoulder pain from the air-like gas that was used during the procedure.  Follow these instructions at home: Incision care   Follow instructions from your health care provider about how to take care of your incisions. Make sure you: ? Wash your hands with soap and water before you change your bandage (dressing). If soap and water are not available, use hand sanitizer. ? Change your dressing as told by your health care provider. ? Leave stitches (sutures), skin glue, or adhesive strips in place. These skin closures may need to be in place for 2 weeks or longer. If adhesive strip edges start to loosen and curl up, you may trim the loose edges. Do not remove adhesive strips completely unless your health care provider tells you to do that.  Do not take  baths, swim, or use a hot tub until your health care provider approves. Ask your health care provider if you can take showers. You may only be allowed to take sponge baths for bathing.  Check your incision area every day for signs of infection. Check for: ? More redness, swelling, or pain. ? More fluid or blood. ? Warmth. ? Pus or a bad smell. Activity  Do not drive or use heavy machinery while taking prescription pain medicine.  Do not lift anything that is heavier than 10 lb (4.5 kg) until your health care provider approves.  Do not play contact sports until your health care provider approves.  Do not drive for 24 hours if you were given a medicine to help you relax (sedative).  Rest as needed. Do not return to work or school until your health care provider approves. General instructions  Take over-the-counter and prescription medicines only as told by your health care provider.  To prevent or treat constipation while you are taking prescription pain medicine, your health care provider may recommend that you: ? Drink enough fluid to keep your urine clear or pale yellow. ? Take over-the-counter or prescription medicines. ? Eat foods that are high in fiber, such as fresh fruits and vegetables, whole grains, and beans. ? Limit foods that are high in fat and processed sugars, such as fried and sweet foods. Contact a health care provider if:  You develop a rash.  You have more redness, swelling, or pain around your incisions.  You have more fluid or blood coming from your incisions.  Your incisions feel warm to the touch.  You have pus or a bad smell coming from your incisions.  You have a fever.  One or more of your incisions breaks open. Get help right away if:  You have trouble breathing.  You have chest pain.  You have increasing pain in your shoulders.  You faint or feel dizzy when you stand.  You have severe pain in your abdomen.  You have nausea or vomiting  that lasts for more than one day.  You have leg pain. This information is not intended to replace advice given to you by your health care provider. Make sure you discuss any questions you have with your health care provider. Document Released: 11/26/2005 Document Revised: 06/16/2016 Document Reviewed: 05/14/2016 Elsevier Interactive Patient Education  2018 [place] Anesthesia, Adult General anesthesia is the use of medicines to make a person "go to sleep" (be unconscious) for a medical procedure. General anesthesia is often recommended when a procedure:  Is long.  Requires you to be still or in an unusual position.  Is major and can cause you to lose  blood.  Is impossible to do without general anesthesia.  The medicines used for general anesthesia are called general anesthetics. In addition to making you sleep, the medicines:  Prevent pain.  Control your blood pressure.  Relax your muscles.  Tell a health care provider about:  Any allergies you have.  All medicines you are taking, including vitamins, herbs, eye drops, creams, and over-the-counter medicines.  Any problems you or family members have had with anesthetic medicines.  Types of anesthetics you have had in the past.  Any bleeding disorders you have.  Any surgeries you have had.  Any medical conditions you have.  Any history of heart or lung conditions, such as heart failure, sleep apnea, or chronic obstructive pulmonary disease (COPD).  Whether you are pregnant or may be pregnant.  Whether you use tobacco, alcohol, marijuana, or street drugs.  Any history of Armed forces logistics/support/administrative officer.  Any history of depression or anxiety. What are the risks? Generally, this is a safe procedure. However, problems may occur, including:  Allergic reaction to anesthetics.  Lung and heart problems.  Inhaling food or liquids from your stomach into your lungs (aspiration).  Injury to nerves.  Waking up during  your procedure and being unable to move (rare).  Extreme agitation or a state of mental confusion (delirium) when you wake up from the anesthetic.  Air in the bloodstream, which can lead to stroke.  These problems are more likely to develop if you are having a major surgery or if you have an advanced medical condition. You can prevent some of these complications by answering all of your health care provider's questions thoroughly and by following all pre-procedure instructions. General anesthesia can cause side effects, including:  Nausea or vomiting  A sore throat from the breathing tube.  Feeling cold or shivery.  Feeling tired, washed out, or achy.  Sleepiness or drowsiness.  Confusion or agitation.  What happens before the procedure? Staying hydrated Follow instructions from your health care provider about hydration, which may include:  Up to 2 hours before the procedure - you may continue to drink clear liquids, such as water, clear fruit juice, black coffee, and plain tea.  Eating and drinking restrictions Follow instructions from your health care provider about eating and drinking, which may include:  8 hours before the procedure - stop eating heavy meals or foods such as meat, fried foods, or fatty foods.  6 hours before the procedure - stop eating light meals or foods, such as toast or cereal.  6 hours before the procedure - stop drinking milk or drinks that contain milk.  2 hours before the procedure - stop drinking clear liquids.  Medicines  Ask your health care provider about: ? Changing or stopping your regular medicines. This is especially important if you are taking diabetes medicines or blood thinners. ? Taking medicines such as aspirin and ibuprofen. These medicines can thin your blood. Do not take these medicines before your procedure if your health care provider instructs you not to. ? Taking new dietary supplements or medicines. Do not take these during  the week before your procedure unless your health care provider approves them.  If you are told to take a medicine or to continue taking a medicine on the day of the procedure, take the medicine with sips of water. General instructions   Ask if you will be going home the same day, the following day, or after a longer hospital stay. ? Plan to have someone take you home. ?  Plan to have someone stay with you for the first 24 hours after you leave the hospital or clinic.  For 3-6 weeks before the procedure, try not to use any tobacco products, such as cigarettes, chewing tobacco, and e-cigarettes.  You may brush your teeth on the morning of the procedure, but make sure to spit out the toothpaste. What happens during the procedure?  You will be given anesthetics through a mask and through an IV tube in one of your veins.  You may receive medicine to help you relax (sedative).  As soon as you are asleep, a breathing tube may be used to help you breathe.  An anesthesia specialist will stay with you throughout the procedure. He or she will help keep you comfortable and safe by continuing to give you medicines and adjusting the amount of medicine that you get. He or she will also watch your blood pressure, pulse, and oxygen levels to make sure that the anesthetics do not cause any problems.  If a breathing tube was used to help you breathe, it will be removed before you wake up. The procedure may vary among health care providers and hospitals. What happens after the procedure?  You will wake up, often slowly, after the procedure is complete, usually in a recovery area.  Your blood pressure, heart rate, breathing rate, and blood oxygen level will be monitored until the medicines you were given have worn off.  You may be given medicine to help you calm down if you feel anxious or agitated.  If you will be going home the same day, your health care provider may check to make sure you can stand,  drink, and urinate.  Your health care providers will treat your pain and side effects before you go home.  Do not drive for 24 hours if you received a sedative.  You may: ? Feel nauseous and vomit. ? Have a sore throat. ? Have mental slowness. ? Feel cold or shivery. ? Feel sleepy. ? Feel tired. ? Feel sore or achy, even in parts of your body where you did not have surgery. This information is not intended to replace advice given to you by your health care provider. Make sure you discuss any questions you have with your health care provider. Document Released: 03/04/2008 Document Revised: 05/08/2016 Document Reviewed: 11/10/2015 Elsevier Interactive Patient Education  2018 [place] Anesthesia, Adult, Care After These instructions provide you with information about caring for yourself after your procedure. Your health care provider may also give you more specific instructions. Your treatment has been planned according to current medical practices, but problems sometimes occur. Call your health care provider if you have any problems or questions after your procedure. What can I expect after the procedure? After the procedure, it is common to have:  Vomiting.  A sore throat.  Mental slowness.  It is common to feel:  Nauseous.  Cold or shivery.  Sleepy.  Tired.  Sore or achy, even in parts of your body where you did not have surgery.  Follow these instructions at home: For at least 24 hours after the procedure:  Do not: ? Participate in activities where you could fall or become injured. ? Drive. ? Use heavy machinery. ? Drink alcohol. ? Take sleeping pills or medicines that cause drowsiness. ? Make important decisions or sign legal documents. ? Take care of children on your own.  Rest. Eating and drinking  If you vomit, drink water, juice, or soup when you  can drink without vomiting.  Drink enough fluid to keep your urine clear or pale  yellow.  Make sure you have little or no nausea before eating solid foods.  Follow the diet recommended by your health care provider. General instructions  Have a responsible adult stay with you until you are awake and alert.  Return to your normal activities as told by your health care provider. Ask your health care provider what activities are safe for you.  Take over-the-counter and prescription medicines only as told by your health care provider.  If you smoke, do not smoke without supervision.  Keep all follow-up visits as told by your health care provider. This is important. Contact a health care provider if:  You continue to have nausea or vomiting at home, and medicines are not helpful.  You cannot drink fluids or start eating again.  You cannot urinate after 8-12 hours.  You develop a skin rash.  You have fever.  You have increasing redness at the site of your procedure. Get help right away if:  You have difficulty breathing.  You have chest pain.  You have unexpected bleeding.  You feel that you are having a life-threatening or urgent problem. This information is not intended to replace advice given to you by your health care provider. Make sure you discuss any questions you have with your health care provider. Document Released: 03/04/2001 Document Revised: 04/30/2016 Document Reviewed: 11/10/2015 Elsevier Interactive Patient Education  [employer].

## 2018-01-27 ENCOUNTER — Encounter (HOSPITAL_COMMUNITY)
Admission: RE | Admit: 2018-01-27 | Discharge: 2018-01-27 | Disposition: A | Discharge status: Home / Self Care | Payer: BLUE CROSS/BLUE SHIELD | Source: Ambulatory Visit | Attending: General Surgery | Admitting: General Surgery

## 2018-01-27 ENCOUNTER — Encounter (HOSPITAL_COMMUNITY): Payer: Self-pay

## 2018-01-27 ENCOUNTER — Other Ambulatory Visit: Payer: Self-pay

## 2018-01-27 DIAGNOSIS — Z01812 Encounter for preprocedural laboratory examination: Secondary | ICD-10-CM | POA: Diagnosis present

## 2018-01-27 LAB — BASIC METABOLIC PANEL
Anion gap: 8 (ref 5–15)
BUN: 11 mg/dL (ref 6–20)
CALCIUM: 9.6 mg/dL (ref 8.9–10.3)
CO2: 27 mmol/L (ref 22–32)
Chloride: 104 mmol/L (ref 101–111)
Creatinine, Ser: 0.61 mg/dL (ref 0.44–1.00)
GFR calc non Af Amer: >60 mL/min (ref >60)
Glucose, Bld: 164 mg/dL — ABNORMAL HIGH (ref 65–99)
Potassium: 3.8 mmol/L (ref 3.5–5.1)
SODIUM: 139 mmol/L (ref 135–145)

## 2018-01-27 LAB — CBC
HCT: 43.6 % (ref 36.0–46.0)
Hemoglobin: 14.8 g/dL (ref 12.0–15.0)
MCH: 30.1 pg (ref 26.0–34.0)
MCHC: 33.9 g/dL (ref 30.0–36.0)
MCV: 88.6 fL (ref 78.0–100.0)
Platelets: 246 10*3/uL (ref 150–400)
RBC: 4.92 MIL/uL (ref 3.87–5.11)
RDW: 12.4 % (ref 11.5–15.5)
WBC: 6.9 10*3/uL (ref 4.0–10.5)

## 2018-01-31 ENCOUNTER — Ambulatory Visit (HOSPITAL_COMMUNITY): Payer: BLUE CROSS/BLUE SHIELD | Admitting: Anesthesiology

## 2018-01-31 ENCOUNTER — Encounter (HOSPITAL_COMMUNITY): Payer: Self-pay | Admitting: *Deleted

## 2018-01-31 ENCOUNTER — Encounter (HOSPITAL_COMMUNITY)
Admission: RE | Disposition: A | Discharge status: Home / Self Care | Payer: Self-pay | Source: Ambulatory Visit | Attending: General Surgery

## 2018-01-31 ENCOUNTER — Ambulatory Visit (HOSPITAL_COMMUNITY)
Admission: RE | Admit: 2018-01-31 | Discharge: 2018-01-31 | Disposition: A | Discharge status: Home / Self Care | Payer: BLUE CROSS/BLUE SHIELD | Source: Ambulatory Visit | Attending: General Surgery | Admitting: General Surgery

## 2018-01-31 DIAGNOSIS — Z79899 Other long term (current) drug therapy: Secondary | ICD-10-CM | POA: Diagnosis not present

## 2018-01-31 DIAGNOSIS — K8020 Calculus of gallbladder without cholecystitis without obstruction: Secondary | ICD-10-CM

## 2018-01-31 DIAGNOSIS — Z885 Allergy status to narcotic agent status: Secondary | ICD-10-CM | POA: Insufficient documentation

## 2018-01-31 DIAGNOSIS — K8010 Calculus of gallbladder with chronic cholecystitis without obstruction: Secondary | ICD-10-CM | POA: Insufficient documentation

## 2018-01-31 DIAGNOSIS — K219 Gastro-esophageal reflux disease without esophagitis: Secondary | ICD-10-CM | POA: Insufficient documentation

## 2018-01-31 DIAGNOSIS — F1320 Sedative, hypnotic or anxiolytic dependence, uncomplicated: Secondary | ICD-10-CM | POA: Diagnosis not present

## 2018-01-31 DIAGNOSIS — F419 Anxiety disorder, unspecified: Secondary | ICD-10-CM | POA: Diagnosis not present

## 2018-01-31 DIAGNOSIS — F329 Major depressive disorder, single episode, unspecified: Secondary | ICD-10-CM | POA: Insufficient documentation

## 2018-01-31 DIAGNOSIS — Z87442 Personal history of urinary calculi: Secondary | ICD-10-CM | POA: Diagnosis not present

## 2018-01-31 HISTORY — PX: CHOLECYSTECTOMY: SHX55

## 2018-01-31 SURGERY — LAPAROSCOPIC CHOLECYSTECTOMY
Anesthesia: General

## 2018-01-31 MED ORDER — NALOXONE HCL 0.4 MG/ML IJ SOLN
0.2000 mg | INTRAMUSCULAR | Status: DC | PRN
  Administered 2018-01-31: 0.2 mg via INTRAVENOUS

## 2018-01-31 MED ORDER — ONDANSETRON HCL 4 MG/2ML IJ SOLN
4.0000 mg | Freq: Once | INTRAMUSCULAR | Status: AC
  Administered 2018-01-31: 4 mg via INTRAVENOUS

## 2018-01-31 MED ORDER — LACTATED RINGERS IV SOLN
INTRAVENOUS | Status: DC
  Administered 2018-01-31: 1000 mL via INTRAVENOUS
  Administered 2018-01-31: 500 mL via INTRAVENOUS

## 2018-01-31 MED ORDER — NEOSTIGMINE METHYLSULFATE 10 MG/10ML IV SOLN
INTRAVENOUS | Status: AC
  Filled 2018-01-31: qty 1

## 2018-01-31 MED ORDER — DIPHENHYDRAMINE HCL 50 MG/ML IJ SOLN
25.0000 mg | Freq: Once | INTRAMUSCULAR | Status: AC
  Administered 2018-01-31: 25 mg via INTRAVENOUS

## 2018-01-31 MED ORDER — DEXAMETHASONE SODIUM PHOSPHATE 4 MG/ML IJ SOLN
4.0000 mg | INTRAMUSCULAR | Status: AC
  Administered 2018-01-31: 4 mg via INTRAVENOUS

## 2018-01-31 MED ORDER — FENTANYL CITRATE (PF) 100 MCG/2ML IJ SOLN
INTRAMUSCULAR | Status: AC
  Filled 2018-01-31: qty 2

## 2018-01-31 MED ORDER — ROCURONIUM 10MG/ML (10ML) SYRINGE FOR MEDFUSION PUMP - OPTIME
INTRAVENOUS | Status: DC | PRN
  Administered 2018-01-31: 25 mg via INTRAVENOUS
  Administered 2018-01-31: 5 mg via INTRAVENOUS

## 2018-01-31 MED ORDER — CHLORHEXIDINE GLUCONATE CLOTH 2 % EX PADS: 6.0000 | MEDICATED_PAD | Freq: Once | CUTANEOUS | Status: DC

## 2018-01-31 MED ORDER — BUPIVACAINE HCL (PF) 0.5 % IJ SOLN
INTRAMUSCULAR | Status: AC
  Filled 2018-01-31: qty 30

## 2018-01-31 MED ORDER — FENTANYL CITRATE (PF) 250 MCG/5ML IJ SOLN
INTRAMUSCULAR | Status: AC
  Filled 2018-01-31: qty 5

## 2018-01-31 MED ORDER — PROPOFOL 10 MG/ML IV BOLUS
INTRAVENOUS | Status: AC
  Filled 2018-01-31: qty 40

## 2018-01-31 MED ORDER — FENTANYL CITRATE (PF) 100 MCG/2ML IJ SOLN
25.0000 ug | INTRAMUSCULAR | Status: DC | PRN
  Administered 2018-01-31: 50 ug via INTRAVENOUS
  Filled 2018-01-31: qty 2

## 2018-01-31 MED ORDER — HEMOSTATIC AGENTS (NO CHARGE) OPTIME
TOPICAL | Status: DC | PRN
  Administered 2018-01-31: 1 via TOPICAL

## 2018-01-31 MED ORDER — LIDOCAINE HCL (CARDIAC) 10 MG/ML IV SOLN
INTRAVENOUS | Status: DC | PRN
  Administered 2018-01-31: 40 mg via INTRAVENOUS

## 2018-01-31 MED ORDER — BUPIVACAINE HCL (PF) 0.5 % IJ SOLN
INTRAMUSCULAR | Status: DC | PRN
  Administered 2018-01-31: 10 mL

## 2018-01-31 MED ORDER — GLYCOPYRROLATE 0.2 MG/ML IJ SOLN
INTRAMUSCULAR | Status: AC
  Filled 2018-01-31: qty 3

## 2018-01-31 MED ORDER — MIDAZOLAM HCL 2 MG/2ML IJ SOLN
INTRAMUSCULAR | Status: AC
  Filled 2018-01-31: qty 2

## 2018-01-31 MED ORDER — DIPHENHYDRAMINE HCL 50 MG/ML IJ SOLN
25.0000 mg | Freq: Four times a day (QID) | INTRAMUSCULAR | Status: DC | PRN
  Administered 2018-01-31: 25 mg via INTRAVENOUS

## 2018-01-31 MED ORDER — SUGAMMADEX SODIUM 200 MG/2ML IV SOLN
INTRAVENOUS | Status: AC
  Filled 2018-01-31: qty 2

## 2018-01-31 MED ORDER — SUGAMMADEX SODIUM 200 MG/2ML IV SOLN
INTRAVENOUS | Status: DC | PRN
Start: 2018-01-31 — End: 2018-01-31
  Administered 2018-01-31: 140 mg via INTRAVENOUS

## 2018-01-31 MED ORDER — MIDAZOLAM HCL 2 MG/2ML IJ SOLN
1.0000 mg | INTRAMUSCULAR | Status: AC
  Administered 2018-01-31: 2 mg via INTRAVENOUS

## 2018-01-31 MED ORDER — GLYCOPYRROLATE 0.2 MG/ML IJ SOLN
INTRAMUSCULAR | Status: DC | PRN
  Administered 2018-01-31: .6 mg via INTRAVENOUS

## 2018-01-31 MED ORDER — NALOXONE HCL 0.4 MG/ML IJ SOLN
INTRAMUSCULAR | Status: AC
  Filled 2018-01-31: qty 1

## 2018-01-31 MED ORDER — OXYCODONE HCL 5 MG PO TABS: 5.0000 mg | ORAL_TABLET | ORAL | 0 refills | Status: AC | PRN

## 2018-01-31 MED ORDER — SODIUM CHLORIDE 0.9 % IR SOLN
Status: DC | PRN
  Administered 2018-01-31: 1000 mL

## 2018-01-31 MED ORDER — FENTANYL CITRATE (PF) 100 MCG/2ML IJ SOLN
INTRAMUSCULAR | Status: DC | PRN
  Administered 2018-01-31 (×9): 50 ug via INTRAVENOUS

## 2018-01-31 MED ORDER — NEOSTIGMINE METHYLSULFATE 10 MG/10ML IV SOLN
INTRAVENOUS | Status: DC | PRN
  Administered 2018-01-31: 3.5 mg via INTRAVENOUS

## 2018-01-31 MED ORDER — DEXAMETHASONE SODIUM PHOSPHATE 4 MG/ML IJ SOLN
INTRAMUSCULAR | Status: AC
  Filled 2018-01-31: qty 1

## 2018-01-31 MED ORDER — KETOROLAC TROMETHAMINE 30 MG/ML IJ SOLN
30.0000 mg | Freq: Once | INTRAMUSCULAR | Status: AC
  Administered 2018-01-31: 30 mg via INTRAVENOUS
  Filled 2018-01-31: qty 1

## 2018-01-31 MED ORDER — LIDOCAINE HCL (PF) 1 % IJ SOLN
INTRAMUSCULAR | Status: AC
  Filled 2018-01-31: qty 5

## 2018-01-31 MED ORDER — SUCCINYLCHOLINE CHLORIDE 20 MG/ML IJ SOLN
INTRAMUSCULAR | Status: AC
  Filled 2018-01-31: qty 1

## 2018-01-31 MED ORDER — SUCCINYLCHOLINE 20MG/ML (10ML) SYRINGE FOR MEDFUSION PUMP - OPTIME
INTRAMUSCULAR | Status: DC | PRN
  Administered 2018-01-31: 120 mg via INTRAVENOUS

## 2018-01-31 MED ORDER — DIPHENHYDRAMINE HCL 50 MG/ML IJ SOLN
INTRAMUSCULAR | Status: AC
  Filled 2018-01-31: qty 1

## 2018-01-31 MED ORDER — CEFAZOLIN SODIUM-DEXTROSE 2-4 GM/100ML-% IV SOLN
2.0000 g | INTRAVENOUS | Status: AC
  Administered 2018-01-31: 2 g via INTRAVENOUS
  Filled 2018-01-31: qty 100

## 2018-01-31 MED ORDER — ROCURONIUM BROMIDE 50 MG/5ML IV SOLN
INTRAVENOUS | Status: AC
  Filled 2018-01-31: qty 3

## 2018-01-31 MED ORDER — ONDANSETRON HCL 4 MG/2ML IJ SOLN
INTRAMUSCULAR | Status: AC
  Filled 2018-01-31: qty 2

## 2018-01-31 MED ORDER — PROPOFOL 10 MG/ML IV BOLUS
INTRAVENOUS | Status: DC | PRN
  Administered 2018-01-31: 170 mg via INTRAVENOUS

## 2018-01-31 SURGICAL SUPPLY — 46 items
ADH SKN CLS APL DERMABOND .7 (GAUZE/BANDAGES/DRESSINGS) ×1
APPLIER CLIP ROT 10 11.4 M/L (STAPLE) ×2
APR CLP MED LRG 11.4X10 (STAPLE) ×1
BAG HAMPER (MISCELLANEOUS) ×2 IMPLANT
BAG RETRIEVAL 10 (BASKET) ×1
BLADE SURG 15 STRL LF DISP TIS (BLADE) ×1 IMPLANT
BLADE SURG 15 STRL SS (BLADE) ×2
CHLORAPREP W/TINT 26ML (MISCELLANEOUS) ×2 IMPLANT
CLIP APPLIE ROT 10 11.4 M/L (STAPLE) ×1 IMPLANT
CLOTH BEACON ORANGE TIMEOUT ST (SAFETY) ×2 IMPLANT
COVER LIGHT HANDLE STERIS (MISCELLANEOUS) ×4 IMPLANT
DECANTER SPIKE VIAL GLASS SM (MISCELLANEOUS) ×2 IMPLANT
DERMABOND ADVANCED (GAUZE/BANDAGES/DRESSINGS) ×1
DERMABOND ADVANCED .7 DNX12 (GAUZE/BANDAGES/DRESSINGS) ×1 IMPLANT
ELECT REM PT RETURN 9FT ADLT (ELECTROSURGICAL) ×2
ELECTRODE REM PT RTRN 9FT ADLT (ELECTROSURGICAL) ×1 IMPLANT
FILTER SMOKE EVAC LAPAROSHD (FILTER) ×2 IMPLANT
GLOVE BIO SURGEON STRL SZ 6.5 (GLOVE) ×2 IMPLANT
GLOVE BIOGEL PI IND STRL 6.5 (GLOVE) ×1 IMPLANT
GLOVE BIOGEL PI IND STRL 7.0 (GLOVE) ×2 IMPLANT
GLOVE BIOGEL PI INDICATOR 6.5 (GLOVE) ×1
GLOVE BIOGEL PI INDICATOR 7.0 (GLOVE) ×2
GLOVE ECLIPSE 6.5 STRL STRAW (GLOVE) ×1 IMPLANT
GLOVE SURG SS PI 7.5 STRL IVOR (GLOVE) ×1 IMPLANT
GOWN STRL REUS W/TWL LRG LVL3 (GOWN DISPOSABLE) ×6 IMPLANT
HEMOSTAT SNOW SURGICEL 2X4 (HEMOSTASIS) ×2 IMPLANT
INST SET LAPROSCOPIC AP (KITS) ×2 IMPLANT
KIT ROOM TURNOVER APOR (KITS) ×2 IMPLANT
MANIFOLD NEPTUNE II (INSTRUMENTS) ×2 IMPLANT
NDL INSUFFLATION 14GA 120MM (NEEDLE) ×1 IMPLANT
NEEDLE INSUFFLATION 14GA 120MM (NEEDLE) ×2 IMPLANT
NS IRRIG 1000ML POUR BTL (IV SOLUTION) ×2 IMPLANT
PACK LAP CHOLE LZT030E (CUSTOM PROCEDURE TRAY) ×2 IMPLANT
PAD ARMBOARD 7.5X6 YLW CONV (MISCELLANEOUS) ×2 IMPLANT
SET BASIN LINEN APH (SET/KITS/TRAYS/PACK) ×2 IMPLANT
SLEEVE ENDOPATH XCEL 5M (ENDOMECHANICALS) ×2 IMPLANT
SUT MNCRL AB 4-0 PS2 18 (SUTURE) ×2 IMPLANT
SUT VICRYL 0 UR6 27IN ABS (SUTURE) ×2 IMPLANT
SYS BAG RETRIEVAL 10MM (BASKET) ×1
SYSTEM BAG RETRIEVAL 10MM (BASKET) ×1 IMPLANT
TROCAR ENDO BLADELESS 11MM (ENDOMECHANICALS) ×2 IMPLANT
TROCAR XCEL NON-BLD 5MMX100MML (ENDOMECHANICALS) ×2 IMPLANT
TROCAR XCEL UNIV SLVE 11M 100M (ENDOMECHANICALS) ×2 IMPLANT
TUBE CONNECTING 12X1/4 (SUCTIONS) ×2 IMPLANT
TUBING INSUFFLATION (TUBING) ×2 IMPLANT
WARMER LAPAROSCOPE (MISCELLANEOUS) ×2 IMPLANT

## 2018-01-31 NOTE — Transfer of Care (Signed)
Immediate Anesthesia Transfer of Care Note  Patient: Christy Russo  Procedure(s) Performed: LAPAROSCOPIC CHOLECYSTECTOMY (N/A )  Patient Location: PACU  Anesthesia Type:General  Level of Consciousness: awake and alert   Airway & Oxygen Therapy: Patient Spontanous Breathing  Post-op Assessment: Report given to RN  Post vital signs: Reviewed and stable  Last Vitals:  Vitals:   01/31/18 0720 01/31/18 0835  BP: 132/82 (!) 170/59  Pulse:  80  Resp: (!) 27   Temp:  (P) 36.9 C  SpO2: 97% 92%    Last Pain:  Vitals:   01/31/18 0644  TempSrc: Oral  PainSc: 3       Patients Stated Pain Goal: 5 (80/16/55 3748)  Complications: No apparent anesthesia complications

## 2018-01-31 NOTE — Anesthesia Preprocedure Evaluation (Signed)
Anesthesia Evaluation  Patient identified by MRN, date of birth, ID band Patient awake    Reviewed: Allergy & Precautions, NPO status , Patient's Chart, lab work & pertinent test results  Airway Mallampati: III  TM Distance: <3 FB Neck ROM: Full    Dental  (+) Teeth Intact   Pulmonary neg pulmonary ROS,    breath sounds clear to auscultation       Cardiovascular negative cardio ROS   Rhythm:Regular Rate:Normal     Neuro/Psych PSYCHIATRIC DISORDERS Anxiety Depression negative neurological ROS     GI/Hepatic GERD  Controlled and Medicated,(+)     substance abuse ( hx Benzodiazepine dependence)  alcohol use,   Endo/Other    Renal/GU      Musculoskeletal   Abdominal   Peds  Hematology   Anesthesia Other Findings   Reproductive/Obstetrics                             Anesthesia Physical Anesthesia Plan  ASA: II  Anesthesia Plan: General   Post-op Pain Management:    Induction: Intravenous, Rapid sequence and Cricoid pressure planned  PONV Risk Score and Plan:   Airway Management Planned: Oral ETT and Video Laryngoscope Planned  Additional Equipment:   Intra-op Plan:   Post-operative Plan: Extubation in OR  Informed Consent: I have reviewed the patients History and Physical, chart, labs and discussed the procedure including the risks, benefits and alternatives for the proposed anesthesia with the patient or authorized representative who has indicated his/her understanding and acceptance.     Plan Discussed with:   Anesthesia Plan Comments:         Anesthesia Quick Evaluation

## 2018-01-31 NOTE — Anesthesia Postprocedure Evaluation (Signed)
Anesthesia Post Note  Patient: Christy Russo  Procedure(s) Performed: LAPAROSCOPIC CHOLECYSTECTOMY (N/A )  Patient location during evaluation: PACU Anesthesia Type: General Level of consciousness: awake and alert and oriented Pain management: pain level controlled Vital Signs Assessment: post-procedure vital signs reviewed and stable Respiratory status: spontaneous breathing Cardiovascular status: blood pressure returned to baseline Postop Assessment: no apparent nausea or vomiting Anesthetic complications: no     Last Vitals:  Vitals:   01/31/18 0930 01/31/18 1012  BP: (!) 151/98 (!) 143/98  Pulse: 83 (!) 102  Resp: 15 18  Temp:  36.8 C  SpO2: 94% 94%    Last Pain:  Vitals:   01/31/18 1012  TempSrc: Oral  PainSc: 4                  Graziella Connery

## 2018-01-31 NOTE — Interval H&P Note (Signed)
History and Physical Interval Note:  01/31/2018 7:14 AM  Christy Russo Christy Russo  has presented today for surgery, with the diagnosis of cholelithiasis  The various methods of treatment have been discussed with the patient and family. After consideration of risks, benefits and other options for treatment, the patient has consented to  Procedure(s): LAPAROSCOPIC CHOLECYSTECTOMY (N/A) as a surgical intervention .  The patient's history has been reviewed, patient examined, no change in status, stable for surgery.  I have reviewed the patient's chart and labs.  Questions were answered to the patient's satisfaction.    No questions.  Virl Cagey

## 2018-01-31 NOTE — Anesthesia Procedure Notes (Signed)
Procedure Name: Intubation Date/Time: 01/31/2018 7:34 AM Performed by: Ollen Bowl, CRNA Pre-anesthesia Checklist: Patient identified, Patient being monitored, Timeout performed, Emergency Drugs available and Suction available Patient Re-evaluated:Patient Re-evaluated prior to induction Oxygen Delivery Method: Circle system utilized Preoxygenation: Pre-oxygenation with 100% oxygen Induction Type: IV induction, Rapid sequence and Cricoid Pressure applied Ventilation: Mask ventilation without difficulty Laryngoscope Size: Glidescope (S3) Grade View: Grade I Tube type: Oral Tube size: 7.0 mm Number of attempts: 1 Airway Equipment and Method: Stylet Placement Confirmation: ETT inserted through vocal cords under direct vision,  positive ETCO2 and breath sounds checked- equal and bilateral Secured at: 21 cm Tube secured with: Tape Dental Injury: Teeth and Oropharynx as per pre-operative assessment

## 2018-01-31 NOTE — Op Note (Signed)
Operative Note   Preoperative Diagnosis: Symptomatic cholelithiasis   Postoperative Diagnosis: Same   Procedure(s) Performed: Laparoscopic cholecystectomy   Surgeon: Ria Comment C. Constance Haw, MD   Assistants: Aviva Signs, MD   Anesthesia: General endotracheal   Anesthesiologist: Lerry Liner, MD    Specimens: Gallbladder    Estimated Blood Loss: Minimal    Blood Replacement: None    Complications: None   Wound Class: Clean Contaminated    Operative Findings: Normal gallbladder, elongated with stones    Procedure: The patient was taken to the operating room and placed supine. General endotracheal anesthesia was induced. Intravenous antibiotics were  administered per protocol. An orogastric tube positioned to decompress the stomach. The abdomen was prepared and draped in the usual sterile fashion.    A supraumbilical incision was made and a Veress technique was utilized to achieve pneumoperitoneum to 15 mmHg with carbon dioxide. A 12 mm optiview port was placed through the supraumbilical region, and a 10 mm 0-degree operative laparoscope was introduced. The area underlying the trocar and Veress needle were inspected and without evidence of injury.  Remaining trocars were placed under direct vision. Two 5 mm ports were placed in the right abdomen, between the anterior axillary and midclavicular line.  A final 11 mm port was placed through the mid-epigastrium, near the falciform ligament.    The gallbladder fundus was elevated cephalad and the infundibulum was retracted to the patient's right. There was significant peritoneal fat that was swept away. The gallbladder/cystic duct junction was skeletonized. The cystic artery noted in the triangle of Calot and was also skeletonized.  We then continued liberal medial and lateral dissection until the critical view of safety was achieved.    The cystic duct and cystic artery were doubly clipped and divided. A small branch off the cystic artery  was also clipped.  The gallbladder was then dissected from the liver bed with electrocautery. The specimen was placed in an Endopouch and was retrieved through the epigastric site.    Final inspection revealed acceptable hemostasis. Surgical Emogene Morgan was placed in the gallbladder fossa. A 0 Vicryl fascial sutures were used to close the epigastric and umbilical port sites. Trocars were removed and pneumoperitoneum was released. Skin incisions were closed with 4-0 Monocryl subcuticular sutures and Dermabond. The patient was awakened from anesthesia and extubated without complication.    Curlene Labrum, MD [facility] [address] [place], [place] [address] [phone] (office)

## 2018-01-31 NOTE — Discharge Instructions (Signed)
Discharge Instructions: Shower per your regular routine. Take tylenol and ibuprofen as needed for pain control, alternating every 4-6 hours.  Take Roxicodone for breakthrough pain. Take colace for constipation related to narcotic pain medication. Do not pick at the dermabond glue on your incision sites.   Laparoscopic Cholecystectomy, Care After This sheet gives you information about how to care for yourself after your procedure. Your doctor may also give you more specific instructions. If you have problems or questions, contact your doctor. Follow these instructions at home: Care for cuts from surgery (incisions)   Follow instructions from your doctor about how to take care of your cuts from surgery. Make sure you: ? Wash your hands with soap and water before you change your bandage (dressing). If you cannot use soap and water, use hand sanitizer. ? Change your bandage as told by your doctor. ? Leave stitches (sutures), skin glue, or skin tape (adhesive) strips in place. They may need to stay in place for 2 weeks or longer. If tape strips get loose and curl up, you may trim the loose edges. Do not remove tape strips completely unless your doctor says it is okay.  Do not take baths, swim, or use a hot tub until your doctor says it is okay. Ask your doctor if you can take showers. You may only be allowed to take sponge baths for bathing.  Check your surgical cut area every day for signs of infection. Check for: ? More redness, swelling, or pain. ? More fluid or blood. ? Warmth. ? Pus or a bad smell. Activity  Do not drive or use heavy machinery while taking prescription pain medicine.  Do not lift anything that is heavier than 10 lb (4.5 kg) until your doctor says it is okay.  Do not play contact sports until your doctor says it is okay.  Do not drive for 24 hours if you were given a medicine to help you relax (sedative).  Rest as needed. Do not return to work or school until your  doctor says it is okay. General instructions  Take over-the-counter and prescription medicines only as told by your doctor.  To prevent or treat constipation while you are taking prescription pain medicine, your doctor may recommend that you: ? Drink enough fluid to keep your pee (urine) clear or pale yellow. ? Take over-the-counter or prescription medicines. ? Eat foods that are high in fiber, such as fresh fruits and vegetables, whole grains, and beans. ? Limit foods that are high in fat and processed sugars, such as fried and sweet foods. Contact a doctor if:  You develop a rash.  You have more redness, swelling, or pain around your surgical cuts.  You have more fluid or blood coming from your surgical cuts.  Your surgical cuts feel warm to the touch.  You have pus or a bad smell coming from your surgical cuts.  You have a fever.  One or more of your surgical cuts breaks open. Get help right away if:  You have trouble breathing.  You have chest pain.  You have pain that is getting worse in your shoulders.  You faint or feel dizzy when you stand.  You have very bad pain in your belly (abdomen).  You are sick to your stomach (nauseous) for more than one day.  You have throwing up (vomiting) that lasts for more than one day.  You have leg pain. This information is not intended to replace advice given to you by your  health care provider. Make sure you discuss any questions you have with your health care provider. Document Released: 09/04/2008 Document Revised: 06/16/2016 Document Reviewed: 05/14/2016 Elsevier Interactive Patient Education  2018 [place].   Fatty Liver Fatty liver, also called hepatic steatosis or steatohepatitis, is a condition in which too much fat has built up in your liver cells. The liver removes harmful substances from your bloodstream. It produces fluids your body needs. It also helps your body use and store energy from the food you eat. In  many cases, fatty liver does not cause symptoms or problems. It is often diagnosed when tests are being done for other reasons. However, over time, fatty liver can cause inflammation that may lead to more serious liver problems, such as scarring of the liver (cirrhosis). What are the causes? Causes of fatty liver may include:  Drinking too much alcohol.  Poor nutrition.  Obesity.  Cushing syndrome.  Diabetes.  Hyperlipidemia.  Pregnancy.  Certain drugs.  Poisons.  Some viral infections.  What increases the risk? You may be more likely to develop fatty liver if you:  Abuse alcohol.  Are pregnant.  Are overweight.  Have diabetes.  Have hepatitis.  Have a high triglyceride level.  What are the signs or symptoms? Fatty liver often does not cause any symptoms. In cases where symptoms develop, they can include:  Fatigue.  Weakness.  Weight loss.  Confusion.  Abdominal pain.  Yellowing of your skin and the white parts of your eyes (jaundice).  Nausea and vomiting.  How is this diagnosed? Fatty liver may be diagnosed by:  Physical exam and medical history.  Blood tests.  Imaging tests, such as an ultrasound, CT scan, or MRI.  Liver biopsy. A small sample of liver tissue is removed using a needle. The sample is then looked at under a microscope.  How is this treated? Fatty liver is often caused by other health conditions. Treatment for fatty liver may involve medicines and lifestyle changes to manage conditions such as:  Alcoholism.  High cholesterol.  Diabetes.  Being overweight or obese.  Follow these instructions at home:  Eat a healthy diet as directed by your health care provider.  Exercise regularly. This can help you lose weight and control your cholesterol and diabetes. Talk to your health care provider about an exercise plan and which activities are best for you.  Do not drink alcohol.  Take medicines only as directed by your  health care provider. Contact a health care provider if: You have difficulty controlling your:  Blood sugar.  Cholesterol.  Alcohol consumption.  Get help right away if:  You have abdominal pain.  You have jaundice.  You have nausea and vomiting. This information is not intended to replace advice given to you by your health care provider. Make sure you discuss any questions you have with your health care provider. Document Released: 01/11/2006 Document Revised: 05/03/2016 Document Reviewed: 04/07/2014 Elsevier Interactive Patient Education  2018 [place] POST-ANESTHESIA  IMMEDIATELY FOLLOWING SURGERY:  Do not drive or operate machinery for the first twenty four hours after surgery.  Do not make any important decisions for twenty four hours after surgery or while taking narcotic pain medications or sedatives.  If you develop intractable nausea and vomiting or a severe headache please notify your doctor immediately.  FOLLOW-UP:  Please make an appointment with your surgeon as instructed. You do not need to follow up with anesthesia unless specifically instructed to do so.  WOUND  CARE INSTRUCTIONS (if applicable):  Keep a dry clean dressing on the anesthesia/puncture wound site if there is drainage.  Once the wound has quit draining you may leave it open to air.  Generally you should leave the bandage intact for twenty four hours unless there is drainage.  If the epidural site drains for more than 36-48 hours please call the anesthesia department.  QUESTIONS?:  Please feel free to call your physician or the hospital operator if you have any questions, and they will be happy to assist you.

## 2018-02-03 ENCOUNTER — Encounter (HOSPITAL_COMMUNITY): Payer: Self-pay | Admitting: General Surgery

## 2018-02-17 ENCOUNTER — Encounter: Payer: Self-pay | Admitting: Gastroenterology

## 2018-02-17 ENCOUNTER — Telehealth: Payer: Self-pay | Admitting: Gastroenterology

## 2018-02-17 ENCOUNTER — Ambulatory Visit: Payer: BLUE CROSS/BLUE SHIELD | Admitting: Gastroenterology

## 2018-02-17 NOTE — Telephone Encounter (Signed)
Patient no showed 08/30/17 and today.

## 2018-02-17 NOTE — Telephone Encounter (Signed)
Patient was a no show and letter sent  °

## 2018-02-20 ENCOUNTER — Ambulatory Visit: Payer: BLUE CROSS/BLUE SHIELD | Admitting: General Surgery

## 2018-02-25 ENCOUNTER — Other Ambulatory Visit: Payer: Self-pay | Admitting: Adult Health

## 2018-03-06 ENCOUNTER — Ambulatory Visit (INDEPENDENT_AMBULATORY_CARE_PROVIDER_SITE_OTHER): Payer: Self-pay | Admitting: General Surgery

## 2018-03-06 ENCOUNTER — Encounter: Payer: Self-pay | Admitting: General Surgery

## 2018-03-06 VITALS — BP 163/91 | HR 73 | Temp 96.6°F | Resp 18 | Ht 64.0 in | Wt 161.0 lb

## 2018-03-06 DIAGNOSIS — K8020 Calculus of gallbladder without cholecystitis without obstruction: Secondary | ICD-10-CM

## 2018-03-06 NOTE — Progress Notes (Signed)
[facility] Note   HPI:  52 y.o. Female presents to clinic for post-op follow-up evaluation after a laparoscopic cholecystectomy. Patient reports doing well. Having some RUQ soreness.  Review of Systems:  No fever or chills + diarrhea with fatty foods All other review of systems: otherwise negative   Pathology: Diagnosis Gallbladder - CHRONIC CHOLECYSTITIS AND CHOLELITHIASIS. - THERE IS NO EVIDENCE OF MALIGNANCY.  Vital Signs:  BP (!) 163/91   Pulse 73   Temp (!) 96.6 F (35.9 C)   Resp 18   Ht 5\' 4"  (1.626 m)   Wt 161 lb (73 kg)   BMI 27.64 kg/m    Physical Exam:  Physical Exam  Constitutional: She is well-developed, well-nourished, and in no distress.  HENT:  Head: Normocephalic.  Eyes: Pupils are equal, round, and reactive to light.  Neck: Normal range of motion.  Cardiovascular: Normal rate and regular rhythm.  Pulmonary/Chest: Effort normal.  Abdominal: Soft. She exhibits no distension. There is no tenderness.  Port sites healing no erythema or drainage  Vitals reviewed.   Laboratory studies: None   Imaging:  None    Assessment:  52 y.o. yo Female s/p laparoscopic cholecystectomy. Doing well. Some soreness in RUQ from the gallbladder removal and stretching muscle. .  Plan:  - NSAID as needed for soreness  - Follow up PRN  All of the above recommendations were discussed with the patient, and all of patient's questions were answered to her expressed satisfaction.  Curlene Labrum, MD [facility] [address] [place], [place] [address] [phone] (office)

## 2018-03-06 NOTE — Patient Instructions (Signed)
Ibuprofen/ Motrin as needed for muscle soreness.

## 2018-07-09 ENCOUNTER — Other Ambulatory Visit: Payer: Self-pay | Admitting: Adult Health

## 2018-09-24 DIAGNOSIS — E669 Obesity, unspecified: Secondary | ICD-10-CM | POA: Diagnosis not present

## 2018-09-24 DIAGNOSIS — N951 Menopausal and female climacteric states: Secondary | ICD-10-CM | POA: Diagnosis not present

## 2018-09-24 DIAGNOSIS — M1991 Primary osteoarthritis, unspecified site: Secondary | ICD-10-CM | POA: Diagnosis not present

## 2018-09-24 DIAGNOSIS — G4700 Insomnia, unspecified: Secondary | ICD-10-CM | POA: Diagnosis not present

## 2018-11-05 DIAGNOSIS — H524 Presbyopia: Secondary | ICD-10-CM | POA: Diagnosis not present

## 2018-11-05 DIAGNOSIS — H5203 Hypermetropia, bilateral: Secondary | ICD-10-CM | POA: Diagnosis not present

## 2018-11-05 DIAGNOSIS — H52223 Regular astigmatism, bilateral: Secondary | ICD-10-CM | POA: Diagnosis not present

## 2018-12-11 DIAGNOSIS — G4700 Insomnia, unspecified: Secondary | ICD-10-CM | POA: Diagnosis not present

## 2019-02-04 DIAGNOSIS — E663 Overweight: Secondary | ICD-10-CM | POA: Diagnosis not present

## 2019-02-04 DIAGNOSIS — M7022 Olecranon bursitis, left elbow: Secondary | ICD-10-CM | POA: Diagnosis not present

## 2019-02-04 DIAGNOSIS — Z79899 Other long term (current) drug therapy: Secondary | ICD-10-CM | POA: Diagnosis not present

## 2019-02-09 DIAGNOSIS — Z1211 Encounter for screening for malignant neoplasm of colon: Secondary | ICD-10-CM | POA: Diagnosis not present

## 2019-02-19 ENCOUNTER — Encounter: Payer: Self-pay | Admitting: Gastroenterology

## 2019-03-04 ENCOUNTER — Telehealth: Payer: Self-pay | Admitting: *Deleted

## 2019-03-04 ENCOUNTER — Ambulatory Visit: Payer: Self-pay | Admitting: Adult Health

## 2019-03-04 NOTE — Telephone Encounter (Signed)
Spoke with pt reminding her of today's appt. Pt states she is not going to make it today and will call back to reschedule. [place]

## 2019-03-10 DIAGNOSIS — K219 Gastro-esophageal reflux disease without esophagitis: Secondary | ICD-10-CM | POA: Diagnosis not present

## 2019-04-29 ENCOUNTER — Other Ambulatory Visit: Payer: Self-pay

## 2019-04-29 ENCOUNTER — Telehealth: Payer: Self-pay | Admitting: *Deleted

## 2019-04-29 ENCOUNTER — Ambulatory Visit: Payer: 59 | Admitting: Gastroenterology

## 2019-04-29 NOTE — Telephone Encounter (Signed)
Called patient for virtual visit with AB this AM. She states she does not need this appt any longer. She did stool cards and wants today's appt cancelled.

## 2019-05-05 DIAGNOSIS — Z1211 Encounter for screening for malignant neoplasm of colon: Secondary | ICD-10-CM | POA: Insufficient documentation

## 2019-05-05 NOTE — Progress Notes (Signed)
Opened in error

## 2019-06-17 ENCOUNTER — Other Ambulatory Visit (HOSPITAL_COMMUNITY): Payer: Self-pay | Admitting: Internal Medicine

## 2019-06-17 DIAGNOSIS — Z1231 Encounter for screening mammogram for malignant neoplasm of breast: Secondary | ICD-10-CM

## 2019-06-24 ENCOUNTER — Other Ambulatory Visit: Payer: Self-pay

## 2019-06-24 ENCOUNTER — Ambulatory Visit (HOSPITAL_COMMUNITY)
Admission: RE | Admit: 2019-06-24 | Discharge: 2019-06-24 | Disposition: A | Discharge status: Home / Self Care | Payer: 59 | Source: Ambulatory Visit | Attending: Internal Medicine | Admitting: Internal Medicine

## 2019-06-24 DIAGNOSIS — Z1231 Encounter for screening mammogram for malignant neoplasm of breast: Secondary | ICD-10-CM

## 2019-07-14 ENCOUNTER — Encounter: Payer: Self-pay | Admitting: Orthopaedic Surgery

## 2019-07-14 ENCOUNTER — Ambulatory Visit: Payer: 59 | Admitting: Orthopaedic Surgery

## 2019-09-20 ENCOUNTER — Encounter (HOSPITAL_COMMUNITY): Payer: Self-pay | Admitting: Emergency Medicine

## 2019-09-20 ENCOUNTER — Other Ambulatory Visit: Payer: Self-pay

## 2019-09-20 DIAGNOSIS — E1165 Type 2 diabetes mellitus with hyperglycemia: Secondary | ICD-10-CM | POA: Insufficient documentation

## 2019-09-20 DIAGNOSIS — R739 Hyperglycemia, unspecified: Secondary | ICD-10-CM | POA: Diagnosis present

## 2019-09-20 DIAGNOSIS — Z79899 Other long term (current) drug therapy: Secondary | ICD-10-CM | POA: Diagnosis not present

## 2019-09-20 LAB — BASIC METABOLIC PANEL
Anion gap: 10 (ref 5–15)
BUN: 15 mg/dL (ref 6–20)
CO2: 26 mmol/L (ref 22–32)
Calcium: 9.6 mg/dL (ref 8.9–10.3)
Chloride: 99 mmol/L (ref 98–111)
Creatinine, Ser: 0.63 mg/dL (ref 0.44–1.00)
GFR calc Af Amer: >60 mL/min (ref >60)
GFR calc non Af Amer: >60 mL/min (ref >60)
Glucose, Bld: 420 mg/dL — ABNORMAL HIGH (ref 70–99)
Potassium: 3.6 mmol/L (ref 3.5–5.1)
Sodium: 135 mmol/L (ref 135–145)

## 2019-09-20 LAB — URINALYSIS, ROUTINE W REFLEX MICROSCOPIC
Bacteria, UA: NONE SEEN
Bilirubin Urine: NEGATIVE
Glucose, UA: >=500 mg/dL — AB
Hgb urine dipstick: NEGATIVE
Ketones, ur: 5 mg/dL — AB
Leukocytes,Ua: NEGATIVE
Nitrite: NEGATIVE
Protein, ur: NEGATIVE mg/dL
Specific Gravity, Urine: 1.038 — ABNORMAL HIGH (ref 1.005–1.030)
pH: 5 (ref 5.0–8.0)

## 2019-09-20 LAB — CBC
HCT: 41.7 % (ref 36.0–46.0)
Hemoglobin: 13.8 g/dL (ref 12.0–15.0)
MCH: 29.4 pg (ref 26.0–34.0)
MCHC: 33.1 g/dL (ref 30.0–36.0)
MCV: 88.9 fL (ref 80.0–100.0)
Platelets: 253 10*3/uL (ref 150–400)
RBC: 4.69 MIL/uL (ref 3.87–5.11)
RDW: 12.7 % (ref 11.5–15.5)
WBC: 6.9 10*3/uL (ref 4.0–10.5)
nRBC: 0 % (ref 0.0–0.2)

## 2019-09-20 LAB — CBG MONITORING, ED: Glucose-Capillary: 448 mg/dL — ABNORMAL HIGH (ref 70–99)

## 2019-09-20 NOTE — ED Triage Notes (Signed)
Pt C/O hyperglycemia "for a couple days." PCP told pt to come to ER because pts A1C over 14. Pt states she is not a diabetic. Pt has been having increased thirst and hunger. Pt diaphoretic in triage.

## 2019-09-21 ENCOUNTER — Emergency Department (HOSPITAL_COMMUNITY)
Admission: EM | Admit: 2019-09-21 | Discharge: 2019-09-21 | Disposition: A | Discharge status: Home / Self Care | Payer: 59 | Attending: Emergency Medicine | Admitting: Emergency Medicine

## 2019-09-21 DIAGNOSIS — R739 Hyperglycemia, unspecified: Secondary | ICD-10-CM

## 2019-09-21 DIAGNOSIS — E119 Type 2 diabetes mellitus without complications: Secondary | ICD-10-CM

## 2019-09-21 LAB — CBG MONITORING, ED: Glucose-Capillary: 294 mg/dL — ABNORMAL HIGH (ref 70–99)

## 2019-09-21 MED ORDER — SODIUM CHLORIDE 0.9 % IV BOLUS
1000.0000 mL | Freq: Once | INTRAVENOUS | Status: AC
  Administered 2019-09-21: 1000 mL via INTRAVENOUS

## 2019-09-21 MED ORDER — SODIUM CHLORIDE 0.9 % IV BOLUS
1000.0000 mL | Freq: Once | INTRAVENOUS | Status: AC
  Administered 2019-09-21: 01:00:00 1000 mL via INTRAVENOUS

## 2019-09-21 MED ORDER — INSULIN ASPART 100 UNIT/ML ~~LOC~~ SOLN
4.0000 [IU] | Freq: Once | SUBCUTANEOUS | Status: AC
  Administered 2019-09-21: 01:00:00 4 [IU] via INTRAVENOUS
  Filled 2019-09-21: qty 1

## 2019-09-21 MED ORDER — METFORMIN HCL 500 MG PO TABS: 500.0000 mg | ORAL_TABLET | Freq: Two times a day (BID) | ORAL | 0 refills | Status: DC

## 2019-09-21 NOTE — Discharge Instructions (Addendum)
Start the metformin twice a day.  Please look at the information about a diabetic diet.  Please call Dr. Delice Bison office this morning to let them know about your ED visit and to arrange your follow-up appointments.  Your blood sugar in the emergency department was 420.

## 2019-09-21 NOTE — ED Notes (Signed)
Pt is alert and oriented x 4. Pt has no complaints at this time. Only symtoms pt has experienced is increased thirst and urination.

## 2019-09-21 NOTE — ED Provider Notes (Signed)
[facility] EMERGENCY DEPARTMENT Provider Note   CSN: [id number] Arrival date & time: 09/20/19  2119   Time seen 12:20 AM  History   Chief Complaint Chief Complaint  Patient presents with  . Hyperglycemia    HPI Christy Russo is a 53 y.o. female.     HPI patient states for the past 2 months she has had blurry vision, polyuria, dry mouth, polydipsia, feeling lethargic, feeling hungry all the time, and states she has lost about 15 pounds.  She states she went to [facility] in [place] to get on replacement hormone therapy and they did blood work and called her this week and told her blood sugar was over 400 and her A1c was 14.  She then was called by work after having blood work done at work for a yearly physical and was told her A1c was 14 and she should see her doctor.  She has had nausea yesterday without vomiting, she had diarrhea 3 times yesterday but none today.  She denies abdominal pain, chest pain, or shortness of breath.  She states she feels lightheaded and dizzy and her legs have been aching.  She states she has a headache.  She states her primary care doctor has labeled her as prediabetic for years but she does not know what her A1c had been.  PCP [employer]mond [employer], MD  Past Medical History:  Diagnosis Date  . Anemia   . Anxiety   . Depression   . Fibroids   . GERD (gastroesophageal reflux disease)   . Kidney stones   . Osteoarthritis   . Pinched nerve in neck     Patient Active Problem List   Diagnosis Date Noted  . Encounter for screening colonoscopy 05/05/2019  . Symptomatic cholelithiasis 01/14/2018  . Confusion, postoperative 03/16/2017  . Post-operative state 04/24/2016  . Depression, major, recurrent, moderate ([place]) 04/15/2016  . GAD (generalized anxiety disorder) 04/15/2016  . Benzodiazepine dependence, continuous ([place]) 04/15/2016  . Alcohol use disorder, moderate, dependence ([place]) 04/14/2016  . Hammertoe 03/06/2016    Past Surgical History:   Procedure Laterality Date  .  neck sx 03/17/17    . ABDOMINAL HYSTERECTOMY    . c-section x 2    . CESAREAN SECTION     x 2  . CHOLECYSTECTOMY N/A 01/31/2018   Procedure: LAPAROSCOPIC CHOLECYSTECTOMY;  Surgeon: Virl Cagey, MD;  Location: [facility];  Service: General;  Laterality: N/A;  . NECK SURGERY    . TOE SURGERY Left 03/21/2016  . TUBAL LIGATION       OB History    Gravida  2   Para  2   Term  2   Preterm      AB      Living  2     SAB      TAB      Ectopic      Multiple      Live Births  2            Home Medications    Ambien, aprazolam 05. Mg BID, tapering off cymbalta, omeprazole  Prior to Admission medications   Medication Sig Start Date End Date Taking? Authorizing Provider  acetaminophen (TYLENOL) 500 MG tablet Take 1,000 mg by mouth every 6 (six) hours as needed for moderate pain.    [provider]  ALPRAZolam Duanne Moron) 1 MG tablet Take 1 mg by mouth 4 (four) times daily. PT said she takes only once or twice a week 05/05/16  [provider]  atorvastatin (LIPITOR) 40 MG tablet Take 40 mg by mouth daily.    [provider]  diphenhydrAMINE (BENADRYL) 25 mg capsule Take 25 mg by mouth daily as needed for allergies.    [provider]  DULoxetine (CYMBALTA) 60 MG capsule Take 1 capsule (60 mg total) by mouth daily. For depression 04/17/16   Lindell Spar I, NP  estradiol (ESTRACE) 1 MG tablet TAKE 1 TABLET BY MOUTH ONCE A DAY. 07/10/18   Estill Dooms, NP  metFORMIN (GLUCOPHAGE) 500 MG tablet Take 1 tablet (500 mg total) by mouth 2 (two) times daily with a meal. 09/21/19   Rolland Porter, MD  omeprazole (PRILOSEC) 20 MG capsule Take 1 capsule by mouth daily. 02/20/17   [provider]  ondansetron (ZOFRAN) 4 MG tablet Take 1 tablet (4 mg total) by mouth every 6 (six) hours. Patient not taking: Reported on 06/13/2017 03/13/17   Kem Parkinson, PA-C    Family History Family History  Problem Relation Age of  Onset  . Congestive Heart Failure Maternal Grandmother   . Heart disease Father   . Hypertension Mother   . Heart disease Mother   . Diabetes Mother     Social History Social History   Tobacco Use  . Smoking status: Never Smoker  . Smokeless tobacco: Never Used  Substance Use Topics  . Alcohol use: Yes    Alcohol/week: 2.0 standard drinks    Types: 2 Glasses of wine per week  . Drug use: No  Employed vapes  Allergies   Codeine   Review of Systems Review of Systems  All other systems reviewed and are negative.    Physical Exam Updated Vital Signs BP 140/60   Pulse 62   Temp 98.2 F (36.8 C) (Oral)   Resp 17   Ht 5' 4.5" (1.638 m)   Wt 70.3 kg   SpO2 99%   BMI 26.19 kg/m   Physical Exam Vitals signs and nursing note reviewed.  Constitutional:      General: She is not in acute distress.    Appearance: Normal appearance. She is well-developed. She is not ill-appearing or toxic-appearing.  HENT:     Head: Normocephalic and atraumatic.     Right Ear: External ear normal.     Left Ear: External ear normal.     Nose: Nose normal. No mucosal edema or rhinorrhea.     Mouth/Throat:     Mouth: Mucous membranes are dry.     Dentition: No dental abscesses.     Pharynx: No uvula swelling.  Eyes:     Extraocular Movements: Extraocular movements intact.     Conjunctiva/sclera: Conjunctivae normal.     Pupils: Pupils are equal, round, and reactive to light.  Neck:     Musculoskeletal: Full passive range of motion without pain, normal range of motion and neck supple.  Cardiovascular:     Rate and Rhythm: Normal rate and regular rhythm.     Heart sounds: Normal heart sounds. No murmur. No friction rub. No gallop.   Pulmonary:     Effort: Pulmonary effort is normal. No respiratory distress.     Breath sounds: Normal breath sounds. No wheezing, rhonchi or rales.  Chest:     Chest wall: No tenderness or crepitus.  Musculoskeletal: Normal range of motion.         General: No tenderness.     Comments: Moves all extremities well.   Skin:    General: Skin is warm  and dry.     Coloration: Skin is not pale.     Findings: No erythema or rash.  Neurological:     General: No focal deficit present.     Mental Status: She is alert and oriented to person, place, and time.     Cranial Nerves: No cranial nerve deficit.  Psychiatric:        Mood and Affect: Mood normal. Mood is not anxious.        Speech: Speech normal.        Behavior: Behavior normal.        Thought Content: Thought content normal.      ED Treatments / Results  Labs (all labs ordered are listed, but only abnormal results are displayed) Results for orders placed or performed during the hospital encounter of [id number]  Basic metabolic panel  Result Value Ref Range   Sodium 135 135 - 145 mmol/L   Potassium 3.6 3.5 - 5.1 mmol/L   Chloride 99 98 - 111 mmol/L   CO2 26 22 - 32 mmol/L   Glucose, Bld 420 (H) 70 - 99 mg/dL   BUN 15 6 - 20 mg/dL   Creatinine, Ser 0.63 0.44 - 1.00 mg/dL   Calcium 9.6 8.9 - 10.3 mg/dL   GFR calc non Af Amer >60 >60 mL/min   GFR calc Af Amer >60 >60 mL/min   Anion gap 10 5 - 15  CBC  Result Value Ref Range   WBC 6.9 4.0 - 10.5 K/uL   RBC 4.69 3.87 - 5.11 MIL/uL   Hemoglobin 13.8 12.0 - 15.0 g/dL   HCT 41.7 36.0 - 46.0 %   MCV 88.9 80.0 - 100.0 fL   MCH 29.4 26.0 - 34.0 pg   MCHC 33.1 30.0 - 36.0 g/dL   RDW 12.7 11.5 - 15.5 %   Platelets 253 150 - 400 K/uL   nRBC 0.0 0.0 - 0.2 %  Urinalysis, Routine w reflex microscopic  Result Value Ref Range   Color, Urine YELLOW YELLOW   APPearance CLEAR CLEAR   Specific Gravity, Urine 1.038 (H) 1.005 - 1.030   pH 5.0 5.0 - 8.0   Glucose, UA >=500 (A) NEGATIVE mg/dL   Hgb urine dipstick NEGATIVE NEGATIVE   Bilirubin Urine NEGATIVE NEGATIVE   Ketones, ur 5 (A) NEGATIVE mg/dL   Protein, ur NEGATIVE NEGATIVE mg/dL   Nitrite NEGATIVE NEGATIVE   Leukocytes,Ua NEGATIVE NEGATIVE   RBC / HPF 0-5 0 - 5 RBC/hpf    WBC, UA 0-5 0 - 5 WBC/hpf   Bacteria, UA NONE SEEN NONE SEEN   Squamous Epithelial / LPF 0-5 0 - 5  CBG monitoring, ED  Result Value Ref Range   Glucose-Capillary 448 (H) 70 - 99 mg/dL  CBG monitoring, ED  Result Value Ref Range   Glucose-Capillary 294 (H) 70 - 99 mg/dL   Laboratory interpretation all normal except hyperglycemia    EKG EKG Interpretation  Date/Time:  Monday September 21 2019 00:58:38 EDT Ventricular Rate:  66 PR Interval:    QRS Duration: 91 QT Interval:  399 QTC Calculation: 418 R Axis:   76 Text Interpretation:  Sinus rhythm Probable anteroseptal infarct, old Minimal ST elevation, inferior leads Baseline wander Since last tracing rate slower 15 Mar 2017 Confirmed by Rolland Porter [phone]) on 09/21/2019 1:02:23 AM      Radiology No results found.  Procedures Procedures (including critical care time)  Medications Ordered in ED Medications  sodium chloride 0.9 % bolus 1,000 mL (  0 mLs Intravenous Stopped 09/21/19 0222)  sodium chloride 0.9 % bolus 1,000 mL (0 mLs Intravenous Stopped 09/21/19 0223)  insulin aspart (novoLOG) injection 4 Units (4 Units Intravenous Given 09/21/19 0109)     Initial Impression / Assessment and Plan / ED Course  I have reviewed the triage vital signs and the nursing notes.  Pertinent labs & imaging results that were available during my care of the patient were reviewed by me and considered in my medical decision making (see chart for details).      Patient was given 2 L IV fluids and a low dose of IV insulin.  Recheck at 2:20 AM her CBG has improved to 294.  Her IV fluids are only about halfway infused.  We discussed going home when they are finished.  I am going to start her on metformin and she should contact her primary care doctor for follow-up and to see if he wants to maintain her on the metformin.  I will also give her some information about a diabetic diet.    Final Clinical Impressions(s) / ED Diagnoses   Final  diagnoses:  Hyperglycemia  Diabetes mellitus, new onset [facility])    ED Discharge Orders         Ordered    metFORMIN (GLUCOPHAGE) 500 MG tablet  2 times daily with meals     09/21/19 0227         Plan discharge  Rolland Porter, MD, Barbette Or, MD 09/21/19 0230

## 2019-09-23 ENCOUNTER — Emergency Department (HOSPITAL_COMMUNITY)
Admission: EM | Admit: 2019-09-23 | Discharge: 2019-09-23 | Discharge status: Against Medical Advice | Payer: 59 | Attending: Emergency Medicine | Admitting: Emergency Medicine

## 2019-09-23 ENCOUNTER — Encounter (HOSPITAL_COMMUNITY): Payer: Self-pay | Admitting: Emergency Medicine

## 2019-09-23 ENCOUNTER — Other Ambulatory Visit: Payer: Self-pay

## 2019-09-23 DIAGNOSIS — Z5321 Procedure and treatment not carried out due to patient leaving prior to being seen by health care provider: Secondary | ICD-10-CM | POA: Diagnosis not present

## 2019-09-23 DIAGNOSIS — R739 Hyperglycemia, unspecified: Secondary | ICD-10-CM | POA: Diagnosis present

## 2019-09-23 HISTORY — DX: Type 2 diabetes mellitus without complications: E11.9

## 2019-09-23 LAB — URINALYSIS, ROUTINE W REFLEX MICROSCOPIC
Bilirubin Urine: NEGATIVE
Glucose, UA: >=500 mg/dL — AB
Hgb urine dipstick: NEGATIVE
Ketones, ur: 5 mg/dL — AB
Leukocytes,Ua: NEGATIVE
Nitrite: NEGATIVE
Protein, ur: NEGATIVE mg/dL
Specific Gravity, Urine: 1.03 (ref 1.005–1.030)
pH: 5 (ref 5.0–8.0)

## 2019-09-23 LAB — COMPREHENSIVE METABOLIC PANEL
ALT: 77 U/L — ABNORMAL HIGH (ref 0–44)
AST: 52 U/L — ABNORMAL HIGH (ref 15–41)
Albumin: 4 g/dL (ref 3.5–5.0)
Alkaline Phosphatase: 90 U/L (ref 38–126)
Anion gap: 12 (ref 5–15)
BUN: 12 mg/dL (ref 6–20)
CO2: 23 mmol/L (ref 22–32)
Calcium: 9.1 mg/dL (ref 8.9–10.3)
Chloride: 101 mmol/L (ref 98–111)
Creatinine, Ser: 0.51 mg/dL (ref 0.44–1.00)
GFR calc Af Amer: >60 mL/min (ref >60)
GFR calc non Af Amer: >60 mL/min (ref >60)
Glucose, Bld: 361 mg/dL — ABNORMAL HIGH (ref 70–99)
Potassium: 3.3 mmol/L — ABNORMAL LOW (ref 3.5–5.1)
Sodium: 136 mmol/L (ref 135–145)
Total Bilirubin: 1.2 mg/dL (ref 0.3–1.2)
Total Protein: 6.6 g/dL (ref 6.5–8.1)

## 2019-09-23 LAB — CBC WITH DIFFERENTIAL/PLATELET
Abs Immature Granulocytes: 0.03 10*3/uL (ref 0.00–0.07)
Basophils Absolute: 0.1 10*3/uL (ref 0.0–0.1)
Basophils Relative: 1 %
Eosinophils Absolute: 0.3 10*3/uL (ref 0.0–0.5)
Eosinophils Relative: 5 %
HCT: 41.2 % (ref 36.0–46.0)
Hemoglobin: 14.8 g/dL (ref 12.0–15.0)
Immature Granulocytes: 1 %
Lymphocytes Relative: 39 %
Lymphs Abs: 2.3 10*3/uL (ref 0.7–4.0)
MCH: 31.2 pg (ref 26.0–34.0)
MCHC: 35.9 g/dL (ref 30.0–36.0)
MCV: 86.7 fL (ref 80.0–100.0)
Monocytes Absolute: 0.4 10*3/uL (ref 0.1–1.0)
Monocytes Relative: 6 %
Neutro Abs: 2.9 10*3/uL (ref 1.7–7.7)
Neutrophils Relative %: 48 %
Platelets: 237 10*3/uL (ref 150–400)
RBC: 4.75 MIL/uL (ref 3.87–5.11)
RDW: 12.2 % (ref 11.5–15.5)
WBC: 6 10*3/uL (ref 4.0–10.5)
nRBC: 0 % (ref 0.0–0.2)

## 2019-09-23 LAB — CBG MONITORING, ED: Glucose-Capillary: 365 mg/dL — ABNORMAL HIGH (ref 70–99)

## 2019-09-23 NOTE — ED Triage Notes (Signed)
Patient reports persistent hyperglycemia for several days with nausea and generalized weakness , denies fever or chills .

## 2019-09-23 NOTE — ED Notes (Signed)
Pt called for vitals seen walking out of the waiting room.

## 2019-10-03 ENCOUNTER — Other Ambulatory Visit: Payer: Self-pay

## 2019-10-03 ENCOUNTER — Emergency Department (HOSPITAL_COMMUNITY): Admission: EM | Admit: 2019-10-03 | Discharge: 2019-10-03 | Discharge status: Against Medical Advice | Payer: 59

## 2019-11-18 ENCOUNTER — Telehealth: Payer: Self-pay

## 2019-11-18 NOTE — Telephone Encounter (Signed)
[place] at [phone] x [phone] to let them know [facility] is declining a referral to this clinic.  Medical records have been shred.

## 2019-12-03 ENCOUNTER — Ambulatory Visit: Payer: 59 | Attending: Internal Medicine

## 2019-12-03 ENCOUNTER — Other Ambulatory Visit: Payer: Self-pay

## 2019-12-03 DIAGNOSIS — Z20822 Contact with and (suspected) exposure to covid-19: Secondary | ICD-10-CM

## 2019-12-04 LAB — NOVEL CORONAVIRUS, NAA: SARS-CoV-2, NAA: NOT DETECTED

## 2019-12-10 ENCOUNTER — Emergency Department (HOSPITAL_COMMUNITY)
Admission: EM | Admit: 2019-12-10 | Discharge: 2019-12-10 | Disposition: A | Discharge status: Home / Self Care | Payer: 59 | Attending: Emergency Medicine | Admitting: Emergency Medicine

## 2019-12-10 ENCOUNTER — Other Ambulatory Visit: Payer: Self-pay

## 2019-12-10 ENCOUNTER — Encounter (HOSPITAL_COMMUNITY): Payer: Self-pay | Admitting: Emergency Medicine

## 2019-12-10 DIAGNOSIS — R200 Anesthesia of skin: Secondary | ICD-10-CM | POA: Diagnosis present

## 2019-12-10 DIAGNOSIS — Z5321 Procedure and treatment not carried out due to patient leaving prior to being seen by health care provider: Secondary | ICD-10-CM | POA: Diagnosis not present

## 2019-12-10 NOTE — ED Triage Notes (Signed)
Per EMS patient from home, called out for numbness x 4 days. Patient states she was seen by PCP and was sent home. Patient states she is numb all over her body. Patient was observed walking in triage. Patient state her mouth is numb and can not talk due to numbness.

## 2019-12-30 ENCOUNTER — Other Ambulatory Visit (HOSPITAL_COMMUNITY): Payer: Self-pay | Admitting: Orthopaedic Surgery

## 2019-12-30 ENCOUNTER — Other Ambulatory Visit: Payer: Self-pay | Admitting: Orthopaedic Surgery

## 2019-12-30 DIAGNOSIS — M47896 Other spondylosis, lumbar region: Secondary | ICD-10-CM

## 2020-01-07 ENCOUNTER — Other Ambulatory Visit: Payer: Self-pay

## 2020-01-07 ENCOUNTER — Ambulatory Visit (HOSPITAL_COMMUNITY)
Admission: RE | Admit: 2020-01-07 | Discharge: 2020-01-07 | Disposition: A | Discharge status: Home / Self Care | Payer: No Typology Code available for payment source | Source: Ambulatory Visit | Attending: Orthopaedic Surgery | Admitting: Orthopaedic Surgery

## 2020-01-07 DIAGNOSIS — M47896 Other spondylosis, lumbar region: Secondary | ICD-10-CM | POA: Insufficient documentation

## 2020-02-02 ENCOUNTER — Other Ambulatory Visit: Payer: Self-pay

## 2020-02-02 ENCOUNTER — Encounter: Payer: Self-pay | Admitting: *Deleted

## 2020-02-02 ENCOUNTER — Ambulatory Visit (INDEPENDENT_AMBULATORY_CARE_PROVIDER_SITE_OTHER): Payer: Managed Care, Other (non HMO) | Admitting: Diagnostic Neuroimaging

## 2020-02-02 ENCOUNTER — Other Ambulatory Visit: Payer: Self-pay | Admitting: *Deleted

## 2020-02-02 ENCOUNTER — Encounter: Payer: Self-pay | Admitting: Diagnostic Neuroimaging

## 2020-02-02 VITALS — BP 144/83 | HR 73 | Temp 97.1°F | Ht 64.0 in | Wt 146.4 lb

## 2020-02-02 DIAGNOSIS — R202 Paresthesia of skin: Secondary | ICD-10-CM

## 2020-02-02 DIAGNOSIS — R200 Anesthesia of skin: Secondary | ICD-10-CM | POA: Diagnosis not present

## 2020-02-02 NOTE — Progress Notes (Signed)
[facility]  PATIENT: Christy Russo DOB: Aug 20, 1966  REFERRING CLINICIAN: Redmond School, MD HISTORY FROM: patient  REASON FOR VISIT: new consult    HISTORICAL  CHIEF COMPLAINT:  Chief Complaint  Patient presents with  . Paresthesias, neuropathy    rm 6 New Pt "numbness/tingling in feet, hands, legs; loss of temperature sensation; some numbness in face, lips; Gabapentin helps but makes me feel bad"    HISTORY OF PRESENT ILLNESS:   54 year old female here for evaluation numbness and tingling.  Symptoms started 3 to 4 months ago when patient was also diagnosed with new onset diabetes, A1c greater than 14.  Since that time patient has changed her diet and exercise, started on diabetes medications and A1c is now between 6 and 7.  However numbness and tingling problems continue.  She feels symptoms in her hands and feet mainly.  Also notices some numbness in the face, lips.  Feeling hot and cold, having incontinence, ringing in ears spinning sensation blurred vision flushing cramps pain memory loss confusion dizziness and weight loss.  Also with insomnia, anxiety, racing thoughts.  Previously diagnosed with anxiety and bipolar disorder.    REVIEW OF SYSTEMS: Full 14 system review of systems performed and negative with exception of: As per HPI.  ALLERGIES: Allergies  Allergen Reactions  . Codeine Hives    HOME MEDICATIONS: Outpatient Medications Prior to Visit  Medication Sig Dispense Refill  . acetaminophen (TYLENOL) 500 MG tablet Take 1,000 mg by mouth every 6 (six) hours as needed for moderate pain.    Marland Kitchen estradiol (ESTRACE) 1 MG tablet TAKE 1 TABLET BY MOUTH ONCE A DAY. 30 tablet 0  . HUMALOG KWIKPEN 100 UNIT/ML KwikPen as needed.    Marland Kitchen LANTUS 100 UNIT/ML injection 16 u at night    . omeprazole (PRILOSEC) 20 MG capsule Take 1 capsule by mouth daily.  0  . ALPRAZolam (XANAX) 1 MG tablet Take 1 mg by mouth 4 (four) times daily. PT said she takes only  once or twice a week  1  . atorvastatin (LIPITOR) 20 MG tablet Take 20 mg by mouth daily.    . butalbital-acetaminophen-caffeine (FIORICET) 50-325-40 MG tablet TAKE (1) TO (2) TABS BYLMOUTH EVERY FOUR HOURS AS NEEDED. DO NOT EXCEED MORE THEN 6 TABLETS IN 24 HOURS.    Marland Kitchen diphenhydrAMINE (BENADRYL) 25 mg capsule Take 25 mg by mouth daily as needed for allergies.    . DULoxetine (CYMBALTA) 60 MG capsule Take 1 capsule (60 mg total) by mouth daily. For depression 30 capsule 0  . gabapentin (NEURONTIN) 300 MG capsule Take 300 mg by mouth 3 (three) times daily.    . metFORMIN (GLUCOPHAGE) 500 MG tablet Take 1 tablet (500 mg total) by mouth 2 (two) times daily with a meal. 60 tablet 0  . ondansetron (ZOFRAN) 4 MG tablet Take 1 tablet (4 mg total) by mouth every 6 (six) hours. (Patient not taking: Reported on 06/13/2017) 10 tablet 0  . traMADol (ULTRAM) 50 MG tablet Take 50-100 mg by mouth 4 (four) times daily as needed.    . venlafaxine XR (EFFEXOR-XR) 37.5 MG 24 hr capsule Take 37.5 mg by mouth 2 (two) times daily.    Marland Kitchen zolpidem (AMBIEN) 10 MG tablet Take 10 mg by mouth at bedtime.     No facility-administered medications prior to visit.    PAST MEDICAL HISTORY: Past Medical History:  Diagnosis Date  . Anemia   . Anxiety   . Depression   . Diabetes  mellitus without complication ([place])    dx 09/2019  . Fibroids   . GERD (gastroesophageal reflux disease)   . Kidney stones   . Osteoarthritis   . Paresthesias   . Pinched nerve in neck     PAST SURGICAL HISTORY: Past Surgical History:  Procedure Laterality Date  .  neck sx 03/17/17    . ABDOMINAL HYSTERECTOMY    . CESAREAN SECTION     x 2  . CHOLECYSTECTOMY N/A 01/31/2018   Procedure: LAPAROSCOPIC CHOLECYSTECTOMY;  Surgeon: Virl Cagey, MD;  Location: [facility];  Service: General;  Laterality: N/A;  . NECK SURGERY  2018  . TOE SURGERY Left 03/21/2016  . TUBAL LIGATION      FAMILY HISTORY: Family History  Problem Relation Age of Onset   . Congestive Heart Failure Maternal Grandmother   . Heart disease Father   . Hypertension Mother   . Heart disease Mother   . Diabetes Mother     SOCIAL HISTORY: Social History   Socioeconomic History  . Marital status: Divorced    Spouse name: Not on file  . Number of children: 2  . Years of education: 15  . Highest education level: Not on file  Occupational History  . Not on file  Tobacco Use  . Smoking status: Current Every Day Smoker    Types: Cigarettes  . Smokeless tobacco: Never Used  . Tobacco comment: 02/02/20  4 a day  Substance and Sexual Activity  . Alcohol use: Yes    Alcohol/week: 2.0 standard drinks    Types: 2 Glasses of wine per week    Comment: occas  . Drug use: No  . Sexual activity: Not Currently    Birth control/protection: Surgical    Comment: hyst  Other Topics Concern  . Not on file  Social History Narrative   Lives alone   Caffeine- 1 c daily   Social Determinants of Health   Financial Resource Strain:   . Difficulty of Paying Living Expenses: Not on file  Food Insecurity:   . Worried About Charity fundraiser in the Last Year: Not on file  . Ran Out of Food in the Last Year: Not on file  Transportation Needs:   . Lack of Transportation (Medical): Not on file  . Lack of Transportation (Non-Medical): Not on file  Physical Activity:   . Days of Exercise per Week: Not on file  . Minutes of Exercise per Session: Not on file  Stress:   . Feeling of Stress : Not on file  Social Connections:   . Frequency of Communication with Friends and Family: Not on file  . Frequency of Social Gatherings with Friends and Family: Not on file  . Attends Religious Services: Not on file  . Active Member of Clubs or Organizations: Not on file  . Attends Archivist Meetings: Not on file  . Marital Status: Not on file  Intimate Partner Violence:   . Fear of Current or Ex-Partner: Not on file  . Emotionally Abused: Not on file  . Physically  Abused: Not on file  . Sexually Abused: Not on file     PHYSICAL EXAM  GENERAL EXAM/CONSTITUTIONAL: Vitals:  Vitals:   02/02/20 1535  BP: (!) 144/83  Pulse: 73  Temp: (!) 97.1 F (36.2 C)  Weight: 146 lb 6.4 oz (66.4 kg)  Height: 5\' 4"  (1.626 m)     Body mass index is 25.13 kg/m. Wt Readings from Last 3 Encounters:  02/02/20 146 lb 6.4 oz (66.4 kg)  12/10/19 145 lb (65.8 kg)  09/20/19 155 lb (70.3 kg)     Patient is in no distress; well developed, nourished and groomed; neck is supple  CARDIOVASCULAR:  Examination of carotid arteries is normal; no carotid bruits  Regular rate and rhythm, no murmurs  Examination of peripheral vascular system by observation and palpation is normal  EYES:  Ophthalmoscopic exam of optic discs and posterior segments is normal; no papilledema or hemorrhages  No exam data present  MUSCULOSKELETAL:  Gait, strength, tone, movements noted in Neurologic exam below  NEUROLOGIC: MENTAL STATUS:  No flowsheet data found.  awake, alert, oriented to person, place and time  recent and remote memory intact  normal attention and concentration  language fluent, comprehension intact, naming intact  fund of knowledge appropriate  CRANIAL NERVE:   2nd - no papilledema on fundoscopic exam  2nd, 3rd, 4th, 6th - pupils equal and reactive to light, visual fields full to confrontation, extraocular muscles intact, no nystagmus  5th - facial sensation symmetric  7th - facial strength symmetric  8th - hearing intact  9th - palate elevates symmetrically, uvula midline  11th - shoulder shrug symmetric  12th - tongue protrusion midline  MOTOR:   normal bulk and tone, full strength in the BUE, BLE  SENSORY:   normal and symmetric to light touch, temperature, vibration  COORDINATION:   finger-nose-finger, fine finger movements normal  REFLEXES:   deep tendon reflexes present and symmetric  GAIT/STATION:   narrow based  gait     DIAGNOSTIC DATA (LABS, IMAGING, TESTING) - I reviewed patient records, labs, notes, testing and imaging myself where available.  Lab Results  Component Value Date   WBC 6.0 09/23/2019   HGB 14.8 09/23/2019   HCT 41.2 09/23/2019   MCV 86.7 09/23/2019   PLT 237 09/23/2019      Component Value Date/Time   NA 136 09/23/2019 0217   K 3.3 (L) 09/23/2019 0217   CL 101 09/23/2019 0217   CO2 23 09/23/2019 0217   GLUCOSE 361 (H) 09/23/2019 0217   BUN 12 09/23/2019 0217   CREATININE 0.51 09/23/2019 0217   CALCIUM 9.1 09/23/2019 0217   PROT 6.6 09/23/2019 0217   ALBUMIN 4.0 09/23/2019 0217   AST 52 (H) 09/23/2019 0217   ALT 77 (H) 09/23/2019 0217   ALKPHOS 90 09/23/2019 0217   BILITOT 1.2 09/23/2019 0217   GFRNONAA >60 09/23/2019 0217   GFRAA >60 09/23/2019 0217   No results found for: CHOL, HDL, LDLCALC, LDLDIRECT, TRIG, CHOLHDL No results found for: HGBA1C No results found for: VITAMINB12 No results found for: TSH     ASSESSMENT AND PLAN  54 y.o. year old female here with numbness and tingling and pain for past 3 to 4 months, in the setting of new diagnosis of diabetes.  Most likely represents diabetic neuropathy and paresthesias.  Continue diabetes treatments.  Try additional supplements and treatments for symptomatic control.  Meds tried: gabapentin  Dx:  1. Numbness and tingling      PLAN:  DIABETIC NEUROPATHY - continue diabetes treatments (medication, nutrition, exercise) - consider alpha-lipoic acid 600mg  twice a day  - consider cymbalta 30-60mg  daily  ANXIETY / INSOMNIA / BIPOLAR D/O - per PCP; consider psychiatry evaluation  Return for return to PCP, pending if symptoms worsen or fail to improve.    Penni Bombard, MD [id number], [id number] PM Certified in Neurology, Neurophysiology and Neuroimaging  [facility] [id number]  [address] [place], [place] [address] [phone]

## 2020-02-02 NOTE — Patient Instructions (Signed)
-   consider alpha-lipoic acid 600mg  twice a day  - consider cymbalta 30-60mg  daily

## 2020-04-26 DIAGNOSIS — Z0289 Encounter for other administrative examinations: Secondary | ICD-10-CM

## 2020-07-14 ENCOUNTER — Other Ambulatory Visit (HOSPITAL_COMMUNITY): Payer: Self-pay | Admitting: Internal Medicine

## 2020-07-14 DIAGNOSIS — M25512 Pain in left shoulder: Secondary | ICD-10-CM

## 2020-07-20 ENCOUNTER — Other Ambulatory Visit: Payer: Self-pay

## 2020-07-20 ENCOUNTER — Ambulatory Visit (HOSPITAL_COMMUNITY)
Admission: RE | Admit: 2020-07-20 | Discharge: 2020-07-20 | Disposition: A | Discharge status: Home / Self Care | Payer: No Typology Code available for payment source | Source: Ambulatory Visit | Attending: Internal Medicine | Admitting: Internal Medicine

## 2020-07-20 DIAGNOSIS — M25512 Pain in left shoulder: Secondary | ICD-10-CM | POA: Diagnosis not present

## 2020-09-15 ENCOUNTER — Ambulatory Visit: Payer: No Typology Code available for payment source | Admitting: Orthopedic Surgery

## 2020-09-22 ENCOUNTER — Encounter: Payer: Self-pay | Admitting: Orthopedic Surgery

## 2020-09-22 ENCOUNTER — Other Ambulatory Visit: Payer: Self-pay

## 2020-09-22 ENCOUNTER — Ambulatory Visit (INDEPENDENT_AMBULATORY_CARE_PROVIDER_SITE_OTHER): Payer: No Typology Code available for payment source | Admitting: Orthopedic Surgery

## 2020-09-22 VITALS — Ht 64.0 in | Wt 155.0 lb

## 2020-09-22 DIAGNOSIS — M7542 Impingement syndrome of left shoulder: Secondary | ICD-10-CM | POA: Diagnosis not present

## 2020-09-22 NOTE — Patient Instructions (Signed)
Work note; Left arm limit, do not raise above the shoulder level  You have received an injection of steroids into the joint. 15% of patients will have increased pain within the 24 hours postinjection.   This is transient and will go away.   We recommend that you use ice packs on the injection site for 20 minutes every 2 hours and extra strength Tylenol 2 tablets every 8 as needed until the pain resolves.  If you continue to have pain after taking the Tylenol and using the ice please call the office for further instructions.

## 2020-09-22 NOTE — Progress Notes (Signed)
Chief Complaint  Patient presents with  . Shoulder Pain    left     There are no diagnoses linked to this encounter.   54 year old female fell at the beginning of the summer and then a week later started having pain which is worsened and now limits her ability to raise her arm and she feels weakness in the shoulder  She did take some Advil some Tylenol and did not improve  Past Medical History:  Diagnosis Date  . Anemia   . Anxiety   . Depression   . Diabetes mellitus without complication ([place])    dx 09/2019  . Fibroids   . GERD (gastroesophageal reflux disease)   . Kidney stones   . Osteoarthritis   . Paresthesias   . Pinched nerve in neck    Ht 5\' 4"  (1.626 m)   Wt 155 lb (70.3 kg)   BMI 26.61 kg/m   She is awake alert and oriented x3  Mood and affect normal  Gait    Left shoulder x-ray July 20, 2020 from [facility].  Although the report says moderate osteoarthritis glenohumeral joint this is a typo because the findings are more consistent with what I see on the film which says moderate narrowing of the acromioclavicular joint and normal glenohumeral joint  However, the Shenton's line of the shoulder is broken indicating chronic rotator cuff disease and there is also abnormality of the acromion with a large inferior spur and then narrowing of course in the [facility] joint  Weakness in abduction especially and also flexion   Positive impingement sign   outside x-ray shows proximal migration of the humerus arthritis of the [facility] joint no glenohumeral arthritis  Procedure note the subacromial injection shoulder left   Verbal consent was obtained to inject the  Left   Shoulder  Timeout was completed to confirm the injection site is a subacromial space of the  left  shoulder  Medication used Depo-Medrol 40 mg and lidocaine 1% 3 cc  Anesthesia was provided by ethyl chloride  The injection was performed in the left  posterior subacromial space. After pinning  the skin with alcohol and anesthetized the skin with ethyl chloride the subacromial space was injected using a 20-gauge needle. There were no complications  Sterile dressing was applied.   I will see him in a week if no improvement MRI of the shoulder

## 2020-09-29 ENCOUNTER — Ambulatory Visit: Payer: No Typology Code available for payment source | Admitting: Orthopedic Surgery

## 2020-10-06 ENCOUNTER — Other Ambulatory Visit: Payer: Self-pay

## 2020-10-06 ENCOUNTER — Ambulatory Visit (INDEPENDENT_AMBULATORY_CARE_PROVIDER_SITE_OTHER): Payer: No Typology Code available for payment source | Admitting: Orthopedic Surgery

## 2020-10-06 ENCOUNTER — Encounter: Payer: Self-pay | Admitting: Orthopedic Surgery

## 2020-10-06 VITALS — BP 156/86 | HR 64 | Ht 64.0 in

## 2020-10-06 DIAGNOSIS — M7542 Impingement syndrome of left shoulder: Secondary | ICD-10-CM

## 2020-10-06 NOTE — Patient Instructions (Signed)
Schedule mri left shoulder

## 2020-10-06 NOTE — Progress Notes (Signed)
Chief Complaint  Patient presents with  . Shoulder Pain    impingment syndrome of left shoulder   54 year old female who started having pain in her left shoulder at the beginning of the summer which worsened and presented to [place] on October 14 with severe shoulder pain  She has taken Advil and Tylenol had a subacromial injection and home exercise program and still has continued pain in her left shoulder with decreased range of motion and weakness  Her x-ray shows that the humerus is riding high in the glenoid suggesting a chronic rotator cuff tear  Repeat examination reveals impingement at 120 degrees weakness in abduction and flexion primarily in the supraspinatus tendon with normal passive range of motion except for the pain  MRI left shoulder

## 2021-07-10 ENCOUNTER — Other Ambulatory Visit (HOSPITAL_COMMUNITY): Payer: Self-pay | Admitting: Internal Medicine

## 2021-07-10 DIAGNOSIS — Z1231 Encounter for screening mammogram for malignant neoplasm of breast: Secondary | ICD-10-CM

## 2021-07-12 ENCOUNTER — Encounter: Payer: Self-pay | Admitting: Internal Medicine

## 2021-10-12 ENCOUNTER — Encounter: Payer: Self-pay | Admitting: Internal Medicine

## 2021-11-08 ENCOUNTER — Ambulatory Visit: Payer: No Typology Code available for payment source | Admitting: Gastroenterology

## 2021-11-22 ENCOUNTER — Telehealth: Payer: Self-pay | Admitting: *Deleted

## 2021-11-22 ENCOUNTER — Ambulatory Visit: Payer: No Typology Code available for payment source | Admitting: Internal Medicine

## 2021-11-22 ENCOUNTER — Encounter: Payer: Self-pay | Admitting: Gastroenterology

## 2021-11-22 ENCOUNTER — Ambulatory Visit: Payer: No Typology Code available for payment source

## 2021-11-22 NOTE — Telephone Encounter (Signed)
Tried to call pt twice for nurse visit by telephone.  No answer and voice mail was not set up.  Unable to leave a message.

## 2021-11-23 ENCOUNTER — Other Ambulatory Visit: Payer: Self-pay

## 2021-11-23 ENCOUNTER — Ambulatory Visit (INDEPENDENT_AMBULATORY_CARE_PROVIDER_SITE_OTHER): Payer: Self-pay | Admitting: *Deleted

## 2021-11-23 ENCOUNTER — Encounter: Payer: Self-pay | Admitting: *Deleted

## 2021-11-23 VITALS — Ht 64.0 in | Wt 150.0 lb

## 2021-11-23 DIAGNOSIS — Z1211 Encounter for screening for malignant neoplasm of colon: Secondary | ICD-10-CM

## 2021-11-23 MED ORDER — CLENPIQ 10-3.5-12 MG-GM -GM/160ML PO SOLN: 1.0000 | Freq: Once | ORAL | 0 refills | Status: AC

## 2021-11-23 NOTE — Progress Notes (Signed)
OK to schedule, propofol. ASA II.  Day of prep: hold metformin AM of TCS: hold metformin

## 2021-11-23 NOTE — Progress Notes (Signed)
Pt remembered that she is taking Ambien 10 mg nightly.  Added to med list.

## 2021-11-23 NOTE — Progress Notes (Addendum)
Gastroenterology Pre-Procedure Review  Request Date: 11/23/2021 Requesting Physician: Dr. Gerarda Fraction, no previous TCS  PATIENT REVIEW QUESTIONS: The patient responded to the following health history questions as indicated:    1. Diabetes Melitis: yes, type II  2. Joint replacements in the past 12 months: no 3. Major health problems in the past 3 months: no 4. Has an artificial valve or MVP: no 5. Has a defibrillator: no 6. Has been advised in past to take antibiotics in advance of a procedure like teeth cleaning: no 7. Family history of colon cancer: no  8. Alcohol Use: yes, 3 or 4 glasses of wine a week 9. Illicit drug Use: no 10. History of sleep apnea: no 11. History of coronary artery or other vascular stents placed within the last 12 months: no 12. History of any prior anesthesia complications: no 13. Body mass index is 25.75 kg/m.    MEDICATIONS & ALLERGIES:    Patient reports the following regarding taking any blood thinners:   Plavix? no Aspirin? no Coumadin? no Brilinta? no Xarelto? no Eliquis? no Pradaxa? no Savaysa? no Effient? no  Patient confirms/reports the following medications:  Current Outpatient Medications  Medication Sig Dispense Refill   acetaminophen (TYLENOL) 500 MG tablet Take 1,000 mg by mouth as needed for moderate pain.     ALPRAZolam (XANAX) 1 MG tablet Take 1 mg by mouth 4 (four) times daily as needed.     DULoxetine (CYMBALTA) 60 MG capsule Take 60 mg by mouth daily.     losartan (COZAAR) 50 MG tablet Take 50 mg by mouth daily at 6 (six) AM.     metFORMIN (GLUCOPHAGE-XR) 500 MG 24 hr tablet Take 500 mg by mouth daily at 6 (six) AM.     omeprazole (PRILOSEC) 20 MG capsule Take 1 capsule by mouth daily.  0   No current facility-administered medications for this visit.    Patient confirms/reports the following allergies:  No Active Allergies  No orders of the defined types were placed in this encounter.   AUTHORIZATION INFORMATION Primary  Insurance: Unknown Jim #: [id number],  Group #: [id number] Pre-Cert / Josem Kaufmann required: No, not required per automated system Pre-Cert / Auth #: REF#: [id number]  SCHEDULE INFORMATION: Procedure has been scheduled as follows:  Date: 12/05/2021, Time: 10:00  Location: [facility] with Dr. Abbey Chatters  This Gastroenterology Pre-Precedure Review Form is being routed to the following provider(s): Neil Crouch, PA-C

## 2021-11-24 ENCOUNTER — Encounter: Payer: Self-pay | Admitting: *Deleted

## 2021-11-24 ENCOUNTER — Other Ambulatory Visit: Payer: Self-pay | Admitting: *Deleted

## 2021-11-24 NOTE — Progress Notes (Signed)
Scheduled procedure for 12/05/2021 with arrival at 8:30.  Mailed prep instructions and diabetes medication adjustment letter to pt.

## 2021-12-05 ENCOUNTER — Ambulatory Visit (HOSPITAL_COMMUNITY): Payer: Managed Care, Other (non HMO) | Admitting: Anesthesiology

## 2021-12-05 ENCOUNTER — Other Ambulatory Visit: Payer: Self-pay

## 2021-12-05 ENCOUNTER — Encounter (HOSPITAL_COMMUNITY): Payer: Self-pay

## 2021-12-05 ENCOUNTER — Ambulatory Visit (HOSPITAL_COMMUNITY)
Admission: RE | Admit: 2021-12-05 | Discharge: 2021-12-05 | Disposition: A | Discharge status: Home / Self Care | Payer: Managed Care, Other (non HMO) | Source: Ambulatory Visit | Attending: Internal Medicine | Admitting: Internal Medicine

## 2021-12-05 ENCOUNTER — Encounter (HOSPITAL_COMMUNITY)
Admission: RE | Disposition: A | Discharge status: Home / Self Care | Payer: Self-pay | Source: Ambulatory Visit | Attending: Internal Medicine

## 2021-12-05 DIAGNOSIS — K648 Other hemorrhoids: Secondary | ICD-10-CM | POA: Diagnosis not present

## 2021-12-05 DIAGNOSIS — F17210 Nicotine dependence, cigarettes, uncomplicated: Secondary | ICD-10-CM | POA: Diagnosis not present

## 2021-12-05 DIAGNOSIS — Z1211 Encounter for screening for malignant neoplasm of colon: Secondary | ICD-10-CM | POA: Diagnosis present

## 2021-12-05 DIAGNOSIS — F419 Anxiety disorder, unspecified: Secondary | ICD-10-CM | POA: Diagnosis not present

## 2021-12-05 DIAGNOSIS — K219 Gastro-esophageal reflux disease without esophagitis: Secondary | ICD-10-CM | POA: Diagnosis not present

## 2021-12-05 DIAGNOSIS — D125 Benign neoplasm of sigmoid colon: Secondary | ICD-10-CM

## 2021-12-05 DIAGNOSIS — F32A Depression, unspecified: Secondary | ICD-10-CM | POA: Diagnosis not present

## 2021-12-05 DIAGNOSIS — E119 Type 2 diabetes mellitus without complications: Secondary | ICD-10-CM | POA: Diagnosis not present

## 2021-12-05 HISTORY — PX: COLONOSCOPY WITH PROPOFOL: SHX5780

## 2021-12-05 HISTORY — PX: POLYPECTOMY: SHX5525

## 2021-12-05 LAB — GLUCOSE, CAPILLARY: Glucose-Capillary: 153 mg/dL — ABNORMAL HIGH (ref 70–99)

## 2021-12-05 SURGERY — COLONOSCOPY WITH PROPOFOL
Anesthesia: General

## 2021-12-05 MED ORDER — STERILE WATER FOR IRRIGATION IR SOLN
Status: DC | PRN
  Administered 2021-12-05: 60 mL

## 2021-12-05 MED ORDER — LACTATED RINGERS IV SOLN: INTRAVENOUS | Status: DC | PRN

## 2021-12-05 MED ORDER — LACTATED RINGERS IV SOLN: INTRAVENOUS | Status: DC

## 2021-12-05 MED ORDER — LIDOCAINE HCL (CARDIAC) PF 100 MG/5ML IV SOSY
PREFILLED_SYRINGE | INTRAVENOUS | Status: DC | PRN
  Administered 2021-12-05: 60 mg via INTRAVENOUS

## 2021-12-05 MED ORDER — PROPOFOL 10 MG/ML IV BOLUS
INTRAVENOUS | Status: DC | PRN
  Administered 2021-12-05: 60 mg via INTRAVENOUS
  Administered 2021-12-05 (×2): 50 mg via INTRAVENOUS
  Administered 2021-12-05 (×3): 20 mg via INTRAVENOUS
  Administered 2021-12-05: 50 mg via INTRAVENOUS
  Administered 2021-12-05: 30 mg via INTRAVENOUS

## 2021-12-05 NOTE — H&P (Signed)
Primary Care Physician:  Redmond School, MD Primary Gastroenterologist:  Dr. Abbey Chatters  Pre-Procedure History & Physical: HPI:  Christy Russo is a 55 y.o. female is here for a colonoscopy for colon cancer screening purposes.  Patient denies any family history of colorectal cancer.  No melena or hematochezia.  No abdominal pain or unintentional weight loss.  No change in bowel habits.  Overall feels well from a GI standpoint.  Past Medical History:  Diagnosis Date   Anemia    Anxiety    Depression    Diabetes mellitus without complication ([place])    dx 09/2019   Fibroids    GERD (gastroesophageal reflux disease)    Kidney stones    Osteoarthritis    Paresthesias    Pinched nerve in neck     Past Surgical History:  Procedure Laterality Date    neck sx 03/17/17     ABDOMINAL HYSTERECTOMY     CESAREAN SECTION     x 2   CHOLECYSTECTOMY N/A 01/31/2018   Procedure: LAPAROSCOPIC CHOLECYSTECTOMY;  Surgeon: Virl Cagey, MD;  Location: [facility];  Service: General;  Laterality: N/A;   NECK SURGERY  2018   TOE SURGERY Left 03/21/2016   TUBAL LIGATION      Prior to Admission medications   Medication Sig Start Date End Date Taking? Authorizing Provider  acetaminophen (TYLENOL) 500 MG tablet Take 1,000 mg by mouth as needed for moderate pain.   Yes [provider]  ALPRAZolam Duanne Moron) 1 MG tablet Take 1 mg by mouth 4 (four) times daily as needed for anxiety. 08/30/20  Yes [provider]  CLENPIQ 10-3.5-12 MG-GM -GM/160ML SOLN Take 320 mLs by mouth as directed. 11/23/21  Yes [provider]  DULoxetine (CYMBALTA) 60 MG capsule Take 60 mg by mouth in the morning. 08/15/20  Yes [provider]  folic acid (FOLVITE) 1 MG tablet Take 1 mg by mouth daily. 11/25/21  Yes [provider]  losartan (COZAAR) 50 MG tablet Take 50 mg by mouth in the morning. 09/19/20  Yes [provider]  metFORMIN (GLUCOPHAGE-XR) 500 MG 24 hr tablet Take 500 mg by  mouth daily at 6 (six) AM. 09/19/20  Yes [provider]  omeprazole (PRILOSEC) 20 MG capsule Take 20 mg by mouth in the morning. 02/20/17  Yes [provider]  rosuvastatin (CRESTOR) 20 MG tablet Take 20 mg by mouth daily. 11/25/21  Yes [provider]  zolpidem (AMBIEN) 10 MG tablet Take 10 mg by mouth at bedtime. 11/14/21  Yes [provider]    Allergies as of 11/24/2021   (No Active Allergies)    Family History  Problem Relation Age of Onset   Congestive Heart Failure Maternal Grandmother    Heart disease Father    Hypertension Mother    Heart disease Mother    Diabetes Mother     Social History   Socioeconomic History   Marital status: Divorced    Spouse name: Not on file   Number of children: 2   Years of education: 28   Highest education level: Not on file  Occupational History   Not on file  Tobacco Use   Smoking status: Every Day    Packs/day: 0.25    Types: Cigarettes   Smokeless tobacco: Never   Tobacco comments:    02/02/20  4 a day  Vaping Use   Vaping Use: Never used  Substance and Sexual Activity   Alcohol use: Yes    Alcohol/week:  2.0 standard drinks    Types: 2 Glasses of wine per week    Comment: occas   Drug use: No   Sexual activity: Not Currently    Birth control/protection: Surgical    Comment: hyst  Other Topics Concern   Not on file  Social History Narrative   Lives alone   Caffeine- 1 c daily   Social Determinants of Health   Financial Resource Strain: Not on file  Food Insecurity: Not on file  Transportation Needs: Not on file  Physical Activity: Not on file  Stress: Not on file  Social Connections: Not on file  Intimate Partner Violence: Not on file    Review of Systems: See HPI, otherwise negative ROS  Physical Exam: Vital signs in last 24 hours: Temp:  [98.4 F (36.9 C)] 98.4 F (36.9 C) (12/27 0833) Pulse Rate:  [65] 65 (12/27 0833) Resp:  [14] 14 (12/27 0833) BP: (163)/(76)  163/76 (12/27 0833) SpO2:  [97 %] 97 % (12/27 0833) Weight:  [68 kg] 68 kg (12/27 0833)   General:   Alert,  Well-developed, well-nourished, pleasant and cooperative in NAD Head:  Normocephalic and atraumatic. Eyes:  Sclera clear, no icterus.   Conjunctiva pink. Ears:  Normal auditory acuity. Nose:  No deformity, discharge,  or lesions. Mouth:  No deformity or lesions, dentition normal. Neck:  Supple; no masses or thyromegaly. Lungs:  Clear throughout to auscultation.   No wheezes, crackles, or rhonchi. No acute distress. Heart:  Regular rate and rhythm; no murmurs, clicks, rubs,  or gallops. Abdomen:  Soft, nontender and nondistended. No masses, hepatosplenomegaly or hernias noted. Normal bowel sounds, without guarding, and without rebound.   Msk:  Symmetrical without gross deformities. Normal posture. Extremities:  Without clubbing or edema. Neurologic:  Alert and  oriented x4;  grossly normal neurologically. Skin:  Intact without significant lesions or rashes. Cervical Nodes:  No significant cervical adenopathy. Psych:  Alert and cooperative. Normal mood and affect.  Impression/Plan: Christy Russo is here for a colonoscopy to be performed for colon cancer screening purposes.  The risks of the procedure including infection, bleed, or perforation as well as benefits, limitations, alternatives and imponderables have been reviewed with the patient. Questions have been answered. All parties agreeable.

## 2021-12-05 NOTE — Anesthesia Procedure Notes (Signed)
Date/Time: 12/05/2021 9:54 AM Performed by: Orlie Dakin, CRNA Pre-anesthesia Checklist: Patient identified, Emergency Drugs available, Patient being monitored and Suction available Patient Re-evaluated:Patient Re-evaluated prior to induction Oxygen Delivery Method: Nasal cannula Induction Type: IV induction Placement Confirmation: positive ETCO2

## 2021-12-05 NOTE — Anesthesia Postprocedure Evaluation (Signed)
Anesthesia Post Note  Patient: BRYTON WAIGHT  Procedure(s) Performed: COLONOSCOPY WITH PROPOFOL POLYPECTOMY  Patient location during evaluation: Phase II Anesthesia Type: General Level of consciousness: awake Pain management: pain level controlled Vital Signs Assessment: post-procedure vital signs reviewed and stable Respiratory status: spontaneous breathing and respiratory function stable Cardiovascular status: blood pressure returned to baseline and stable Postop Assessment: no headache and no apparent nausea or vomiting Anesthetic complications: no Comments: Late entry   No notable events documented.   Last Vitals:  Vitals:   12/05/21 0833 12/05/21 1011  BP: (!) 163/76 (!) 127/58  Pulse: 65 81  Resp: 14 16  Temp: 36.9 C 36.9 C  SpO2: 97% 95%    Last Pain:  Vitals:   12/05/21 1011  TempSrc: Oral  PainSc: 0-No pain                 Louann Sjogren

## 2021-12-05 NOTE — Discharge Instructions (Addendum)
°  Colonoscopy Discharge Instructions  Read the instructions outlined below and refer to this sheet in the next few weeks. These discharge instructions provide you with general information on caring for yourself after you leave the hospital. Your doctor may also give you specific instructions. While your treatment has been planned according to the most current medical practices available, unavoidable complications occasionally occur.   ACTIVITY You may resume your regular activity, but move at a slower pace for the next 24 hours.  Take frequent rest periods for the next 24 hours.  Walking will help get rid of the air and reduce the bloated feeling in your belly (abdomen).  No driving for 24 hours (because of the medicine (anesthesia) used during the test).   Do not sign any important legal documents or operate any machinery for 24 hours (because of the anesthesia used during the test).  NUTRITION Drink plenty of fluids.  You may resume your normal diet as instructed by your doctor.  Begin with a light meal and progress to your normal diet. Heavy or fried foods are harder to digest and may make you feel sick to your stomach (nauseated).  Avoid alcoholic beverages for 24 hours or as instructed.  MEDICATIONS You may resume your normal medications unless your doctor tells you otherwise.  WHAT YOU CAN EXPECT TODAY Some feelings of bloating in the abdomen.  Passage of more gas than usual.  Spotting of blood in your stool or on the toilet paper.  IF YOU HAD POLYPS REMOVED DURING THE COLONOSCOPY: No aspirin products for 7 days or as instructed.  No alcohol for 7 days or as instructed.  Eat a soft diet for the next 24 hours.  FINDING OUT THE RESULTS OF YOUR TEST Not all test results are available during your visit. If your test results are not back during the visit, make an appointment with your caregiver to find out the results. Do not assume everything is normal if you have not heard from your  caregiver or the medical facility. It is important for you to follow up on all of your test results.  SEEK IMMEDIATE MEDICAL ATTENTION IF: You have more than a spotting of blood in your stool.  Your belly is swollen (abdominal distention).  You are nauseated or vomiting.  You have a temperature over 101.  You have abdominal pain or discomfort that is severe or gets worse throughout the day.   Your colonoscopy revealed 4 polyp(s) which I removed successfully. Await pathology results, my office will contact you. I recommend repeating colonoscopy in 5 years for surveillance purposes. Otherwise follow up with GI as needed.    I hope you have a great rest of your week!  Charles K. Carver, D.O. Gastroenterology and Hepatology [facility]  

## 2021-12-05 NOTE — Anesthesia Preprocedure Evaluation (Signed)
Anesthesia Evaluation  Patient identified by MRN, date of birth, ID band Patient awake    Reviewed: Allergy & Precautions, H&P , NPO status , Patient's Chart, lab work & pertinent test results, reviewed documented beta blocker date and time   Airway Mallampati: II  TM Distance: >3 FB Neck ROM: full    Dental no notable dental hx.    Pulmonary neg pulmonary ROS, Current Smoker and Patient abstained from smoking.,    Pulmonary exam normal breath sounds clear to auscultation       Cardiovascular Exercise Tolerance: Good negative cardio ROS   Rhythm:regular Rate:Normal     Neuro/Psych PSYCHIATRIC DISORDERS Anxiety Depression negative neurological ROS     GI/Hepatic Neg liver ROS, GERD  Medicated,  Endo/Other  negative endocrine ROSdiabetes, Type 2  Renal/GU Renal disease  negative genitourinary   Musculoskeletal   Abdominal   Peds  Hematology  (+) Blood dyscrasia, anemia ,   Anesthesia Other Findings   Reproductive/Obstetrics negative OB ROS                             Anesthesia Physical Anesthesia Plan  ASA: 2  Anesthesia Plan: General   Post-op Pain Management:    Induction:   PONV Risk Score and Plan: Propofol infusion  Airway Management Planned:   Additional Equipment:   Intra-op Plan:   Post-operative Plan:   Informed Consent: I have reviewed the patients History and Physical, chart, labs and discussed the procedure including the risks, benefits and alternatives for the proposed anesthesia with the patient or authorized representative who has indicated his/her understanding and acceptance.     Dental Advisory Given  Plan Discussed with: CRNA  Anesthesia Plan Comments:         Anesthesia Quick Evaluation

## 2021-12-05 NOTE — Transfer of Care (Signed)
Immediate Anesthesia Transfer of Care Note  Patient: Christy Russo  Procedure(s) Performed: COLONOSCOPY WITH PROPOFOL POLYPECTOMY  Patient Location: Endoscopy Unit  Anesthesia Type:General  Level of Consciousness: awake and oriented  Airway & Oxygen Therapy: Patient Spontanous Breathing  Post-op Assessment: Report given to RN and Post -op Vital signs reviewed and stable  Post vital signs: Reviewed and stable  Last Vitals:  Vitals Value Taken Time  BP    Temp    Pulse    Resp    SpO2      Last Pain:  Vitals:   12/05/21 0946  TempSrc:   PainSc: 0-No pain      Patients Stated Pain Goal: 8 (10/30/61 4469)  Complications: No notable events documented.

## 2021-12-05 NOTE — Op Note (Signed)
[facility] Patient Name: Christy Russo Procedure Date: 12/05/2021 9:36 AM MRN: [id number] Date of Birth: [birth year] Attending MD: Elon Alas. Edgar Frisk CSN: [id number] Age: 55 Admit Type: Outpatient Procedure:                Colonoscopy Indications:              Screening for colorectal malignant neoplasm Providers:                Elon Alas. Abbey Chatters, DO, Lambert Mody, Aram Candela Referring MD:              Medicines:                See the Anesthesia note for documentation of the                            administered medications Complications:            No immediate complications. Estimated Blood Loss:     Estimated blood loss was minimal. Procedure:                Pre-Anesthesia Assessment:                           - The anesthesia plan was to use monitored                            anesthesia care (MAC).                           After obtaining informed consent, the colonoscope                            was passed under direct vision. Throughout the                            procedure, the patient's blood pressure, pulse, and                            oxygen saturations were monitored continuously. The                            [id number]-HQ190L ([id number]) scope was introduced through                            the anus and advanced to the the cecum, identified                            by appendiceal orifice and ileocecal valve. The                            colonoscopy was performed without difficulty. The                            patient tolerated the procedure well. The quality  of the bowel preparation was evaluated using the                            BBPS [facility] Bowel Preparation Scale) with scores                            of: Right Colon = 3, Transverse Colon = 3 and Left                            Colon = 3 (entire mucosa seen well with no residual                            staining, small  fragments of stool or opaque                            liquid). The total BBPS score equals 9. Scope In: 9:51:00 AM Scope Out: 36:64:40 AM Scope Withdrawal Time: 0 hours 13 minutes 11 seconds  Total Procedure Duration: 0 hours 15 minutes 15 seconds  Findings:      The perianal and digital rectal examinations were normal.      Non-bleeding internal hemorrhoids were found during endoscopy.      Four sessile polyps were found in the sigmoid colon. The polyps were 3       to 7 mm in size. These polyps were removed with a cold snare. Resection       and retrieval were complete.      The exam was otherwise without abnormality. Impression:               - Non-bleeding internal hemorrhoids.                           - Four 3 to 7 mm polyps in the sigmoid colon,                            removed with a cold snare. Resected and retrieved.                           - The examination was otherwise normal. Moderate Sedation:      Per Anesthesia Care Recommendation:           - Patient has a contact number available for                            emergencies. The signs and symptoms of potential                            delayed complications were discussed with the                            patient. Return to normal activities tomorrow.                            Written discharge instructions were provided to the  patient.                           - Resume previous diet.                           - Continue present medications.                           - Await pathology results.                           - Repeat colonoscopy in 5 years for surveillance.                           - Return to GI clinic PRN. Procedure Code(s):        --- Professional ---                           [phone], Colonoscopy, flexible; with removal of                            tumor(s), polyp(s), or other lesion(s) by snare                            technique Diagnosis Code(s):        ---  Professional ---                           Z12.11, Encounter for screening for malignant                            neoplasm of colon                           K63.5, Polyp of colon                           K64.8, Other hemorrhoids CPT copyright 2019 American Medical Association. All rights reserved. The codes documented in this report are preliminary and upon coder review may  be revised to meet current compliance requirements. Elon Alas. Abbey Chatters, DO [place] Abbey Chatters, DO 12/05/2021 10:08:52 AM This report has been signed electronically. Number of Addenda: 0

## 2021-12-06 LAB — SURGICAL PATHOLOGY

## 2021-12-07 ENCOUNTER — Encounter (HOSPITAL_COMMUNITY): Payer: Self-pay | Admitting: Internal Medicine

## 2023-02-07 ENCOUNTER — Encounter: Payer: Self-pay | Admitting: Radiology

## 2023-02-27 ENCOUNTER — Ambulatory Visit (HOSPITAL_COMMUNITY)
Admission: RE | Admit: 2023-02-27 | Discharge: 2023-02-27 | Disposition: A | Discharge status: Home / Self Care | Payer: Managed Care, Other (non HMO) | Source: Ambulatory Visit | Attending: Internal Medicine | Admitting: Internal Medicine

## 2023-02-27 ENCOUNTER — Other Ambulatory Visit (HOSPITAL_COMMUNITY): Payer: Self-pay | Admitting: Internal Medicine

## 2023-02-27 DIAGNOSIS — R102 Pelvic and perineal pain unspecified side: Secondary | ICD-10-CM

## 2023-02-27 DIAGNOSIS — M25552 Pain in left hip: Secondary | ICD-10-CM

## 2023-02-27 DIAGNOSIS — M545 Low back pain, unspecified: Secondary | ICD-10-CM

## 2023-03-01 ENCOUNTER — Emergency Department (HOSPITAL_COMMUNITY)
Admission: EM | Admit: 2023-03-01 | Discharge: 2023-03-01 | Disposition: A | Discharge status: Home / Self Care | Payer: Managed Care, Other (non HMO) | Attending: Emergency Medicine | Admitting: Emergency Medicine

## 2023-03-01 ENCOUNTER — Other Ambulatory Visit: Payer: Self-pay

## 2023-03-01 ENCOUNTER — Emergency Department (HOSPITAL_COMMUNITY): Payer: Managed Care, Other (non HMO)

## 2023-03-01 DIAGNOSIS — L03116 Cellulitis of left lower limb: Secondary | ICD-10-CM

## 2023-03-01 DIAGNOSIS — Z7984 Long term (current) use of oral hypoglycemic drugs: Secondary | ICD-10-CM | POA: Insufficient documentation

## 2023-03-01 DIAGNOSIS — E119 Type 2 diabetes mellitus without complications: Secondary | ICD-10-CM | POA: Insufficient documentation

## 2023-03-01 DIAGNOSIS — Y92009 Unspecified place in unspecified non-institutional (private) residence as the place of occurrence of the external cause: Secondary | ICD-10-CM | POA: Insufficient documentation

## 2023-03-01 DIAGNOSIS — M25562 Pain in left knee: Secondary | ICD-10-CM | POA: Diagnosis present

## 2023-03-01 DIAGNOSIS — W010XXA Fall on same level from slipping, tripping and stumbling without subsequent striking against object, initial encounter: Secondary | ICD-10-CM | POA: Insufficient documentation

## 2023-03-01 LAB — BASIC METABOLIC PANEL
Anion gap: 8 (ref 5–15)
BUN: 16 mg/dL (ref 6–20)
CO2: 24 mmol/L (ref 22–32)
Calcium: 8.3 mg/dL — ABNORMAL LOW (ref 8.9–10.3)
Chloride: 105 mmol/L (ref 98–111)
Creatinine, Ser: 0.57 mg/dL (ref 0.44–1.00)
GFR, Estimated: >60 mL/min (ref >60)
Glucose, Bld: 186 mg/dL — ABNORMAL HIGH (ref 70–99)
Potassium: 4 mmol/L (ref 3.5–5.1)
Sodium: 137 mmol/L (ref 135–145)

## 2023-03-01 LAB — CBC WITH DIFFERENTIAL/PLATELET
Abs Immature Granulocytes: 0.04 10*3/uL (ref 0.00–0.07)
Basophils Absolute: 0.1 10*3/uL (ref 0.0–0.1)
Basophils Relative: 1 %
Eosinophils Absolute: 0.1 10*3/uL (ref 0.0–0.5)
Eosinophils Relative: 2 %
HCT: 37.8 % (ref 36.0–46.0)
Hemoglobin: 12.6 g/dL (ref 12.0–15.0)
Immature Granulocytes: 0 %
Lymphocytes Relative: 33 %
Lymphs Abs: 3.1 10*3/uL (ref 0.7–4.0)
MCH: 30.7 pg (ref 26.0–34.0)
MCHC: 33.3 g/dL (ref 30.0–36.0)
MCV: 92 fL (ref 80.0–100.0)
Monocytes Absolute: 0.6 10*3/uL (ref 0.1–1.0)
Monocytes Relative: 7 %
Neutro Abs: 5.6 10*3/uL (ref 1.7–7.7)
Neutrophils Relative %: 57 %
Platelets: 195 10*3/uL (ref 150–400)
RBC: 4.11 MIL/uL (ref 3.87–5.11)
RDW: 13.2 % (ref 11.5–15.5)
WBC: 9.5 10*3/uL (ref 4.0–10.5)
nRBC: 0 % (ref 0.0–0.2)

## 2023-03-01 MED ORDER — DOXYCYCLINE HYCLATE 100 MG PO CAPS: 100.0000 mg | ORAL_CAPSULE | Freq: Two times a day (BID) | ORAL | 0 refills | Status: DC

## 2023-03-01 MED ORDER — VANCOMYCIN HCL IN DEXTROSE 1-5 GM/200ML-% IV SOLN
1000.0000 mg | Freq: Once | INTRAVENOUS | Status: AC
  Administered 2023-03-01: 1000 mg via INTRAVENOUS
  Filled 2023-03-01: qty 200

## 2023-03-01 MED ORDER — NAPROXEN 500 MG PO TABS: 500.0000 mg | ORAL_TABLET | Freq: Two times a day (BID) | ORAL | 0 refills | Status: AC

## 2023-03-01 NOTE — ED Triage Notes (Signed)
Pt went to urgent care for a fall on Weds to left knee. Pt knee is red warm and swollen. Pt brought xray CD from UC with her.

## 2023-03-01 NOTE — ED Notes (Signed)
Patient transported to X-ray 

## 2023-03-01 NOTE — Discharge Instructions (Signed)
Elevate your knee is much as possible, gentle heat therapy to the knee may also be helpful.  Take the entire course of the doxycycline prescribed.  Also prescribed you an anti-inflammatory pain medication.  Call Dr. Ruthe Mannan office this afternoon for an appointment on Monday for reevaluation of your knee.  If over the weekend you feel your symptoms are getting worse as discussed including or swelling or extending redness, return here for recheck.

## 2023-03-01 NOTE — ED Provider Notes (Signed)
[facility] Provider Note   CSN: [id number] Arrival date & time: 03/01/23  [id number]     History  Chief Complaint  Patient presents with   Knee Pain    Christy Russo is a 57 y.o. female with a history significant for type 2 diabetes presenting for evaluation of pain and swelling of her left knee after falling 2 days ago.  She describes tripping over her cat and landing directly on the left knee onto carpeting in her home.  She was wearing shorts and sustained a small abrasion to her anterior knee.  Since this injury she has had increased pain, swelling, and redness of the knee.  She is having increasing difficulty with weightbearing.  She has had no fevers or chills, no radiation of pain, no posterior knee pain.  She was seen at a local urgent care center and was advised to come here to rule out a knee joint infection.  The history is provided by the patient.       Home Medications Prior to Admission medications   Medication Sig Start Date End Date Taking? Authorizing Provider  doxycycline (VIBRAMYCIN) 100 MG capsule Take 1 capsule (100 mg total) by mouth 2 (two) times daily. 03/01/23  Yes Assyria Morreale, Almyra Free, PA-C  naproxen (NAPROSYN) 500 MG tablet Take 1 tablet (500 mg total) by mouth 2 (two) times daily. 03/01/23  Yes Dartagnan Beavers, Almyra Free, PA-C  acetaminophen (TYLENOL) 500 MG tablet Take 1,000 mg by mouth as needed for moderate pain.    [provider]  ALPRAZolam Duanne Moron) 1 MG tablet Take 1 mg by mouth 4 (four) times daily as needed for anxiety. 08/30/20   [provider]  DULoxetine (CYMBALTA) 60 MG capsule Take 60 mg by mouth in the morning. 08/15/20   [provider]  folic acid (FOLVITE) 1 MG tablet Take 1 mg by mouth daily. 11/25/21   [provider]  losartan (COZAAR) 50 MG tablet Take 50 mg by mouth in the morning. 09/19/20   [provider]  metFORMIN (GLUCOPHAGE-XR) 500 MG 24 hr tablet Take 500 mg by  mouth daily at 6 (six) AM. 09/19/20   [provider]  omeprazole (PRILOSEC) 20 MG capsule Take 20 mg by mouth in the morning. 02/20/17   [provider]  rosuvastatin (CRESTOR) 20 MG tablet Take 20 mg by mouth daily. 11/25/21   [provider]  zolpidem (AMBIEN) 10 MG tablet Take 10 mg by mouth at bedtime. 11/14/21   [provider]      Allergies    Patient has no known allergies.    Review of Systems   Review of Systems  Constitutional:  Negative for fever.  Musculoskeletal:  Positive for arthralgias and joint swelling. Negative for myalgias.  Skin:  Positive for color change.  Neurological:  Negative for weakness and numbness.  All other systems reviewed and are negative.   Physical Exam Updated Vital Signs BP (!) 164/88   Pulse 78   Temp 98.1 F (36.7 C) (Oral)   Resp 18   Ht 5\' 4"  (1.626 m)   Wt 68 kg   SpO2 94%   BMI 25.75 kg/m  Physical Exam Constitutional:      Appearance: She is well-developed.  HENT:     Head: Atraumatic.  Cardiovascular:     Comments: Pulses equal bilaterally Musculoskeletal:        General: Tenderness present.     Cervical back: Normal range of motion.  Left knee: Swelling and erythema present. No lacerations.     Comments: Patient has a small punctate abrasion superior to her left patella.  There is surrounding erythema and increased warmth of the anterior knee.  There is no pain medial or popliteal region.  She is point tender to palpation proximal fibula.  Generalized edema anteriorly, no appreciable joint effusion.    Skin:    General: Skin is warm and dry.  Neurological:     Mental Status: She is alert.     Sensory: No sensory deficit.     Motor: No weakness.     Deep Tendon Reflexes: Reflexes normal.     ED Results / Procedures / Treatments   Labs (all labs ordered are listed, but only abnormal results are displayed) Labs Reviewed  BASIC METABOLIC PANEL - Abnormal; Notable for the  following components:      Result Value   Glucose, Bld 186 (*)    Calcium 8.3 (*)    All other components within normal limits  CBC WITH DIFFERENTIAL/PLATELET    EKG None  Radiology DG Knee Complete 4 Views Left  Result Date: 03/01/2023 CLINICAL DATA:  Pain after fall EXAM: LEFT KNEE - COMPLETE 4 VIEW COMPARISON:  03/01/2023 x-ray earlier FINDINGS: No evidence of fracture, dislocation, or joint effusion. No evidence of arthropathy or other focal bone abnormality. Soft tissues are unremarkable. IMPRESSION: No acute osseous abnormality Electronically Signed   By: Jill Side M.D.   On: 03/01/2023 11:56    Procedures Procedures    Medications Ordered in ED Medications  vancomycin (VANCOCIN) IVPB 1000 mg/200 mL premix (0 mg Intravenous Stopped 03/01/23 1425)    ED Course/ Medical Decision Making/ A&P                             Medical Decision Making Patient can flex and extend her left knee although with some discomfort, localizing to the anterior 3 patellar region.  She has no posterior or medial knee joint pain or erythema.  Her exam suggest a cellulitis of the prepatellar space, unable to eliminate possibility of bursa involvement, although there is no palpable bursitis that would be amenable to I&D.  She is given IV dose of vancomycin here, will be disposed home with twice daily doxycycline.  Discussed home care including elevation and gentle heat.  Amount and/or Complexity of Data Reviewed Labs: ordered.    Details: Labs are reassuring with the normal WBC count at 9.5.  Her glucose today is 186, no anion gap. Radiology: ordered.    Details: X-rays negative for acute bony findings, no knee joint effusion Discussion of management or test interpretation with external provider(s): Patient discussed with Dr. Aline Brochure who is agreeable with today's plan and will see patient in his office early next week for reevaluation.  I did discuss with patient coming here over the weekend for  recheck if she thinks her symptoms are worsening despite today's treatment plan.  Risk Prescription drug management.           Final Clinical Impression(s) / ED Diagnoses Final diagnoses:  Cellulitis of left lower extremity    Rx / DC Orders ED Discharge Orders          Ordered    doxycycline (VIBRAMYCIN) 100 MG capsule  2 times daily        03/01/23 1424    naproxen (NAPROSYN) 500 MG tablet  2 times daily  03/01/23 1424              Evalee Jefferson, PA-C 03/01/23 1426    Milton Ferguson, MD 03/01/23 [phone]

## 2023-03-05 ENCOUNTER — Encounter: Payer: Self-pay | Admitting: Orthopaedic Surgery

## 2023-03-05 ENCOUNTER — Ambulatory Visit: Payer: Managed Care, Other (non HMO) | Admitting: Orthopaedic Surgery

## 2023-03-05 VITALS — BP 179/107 | HR 99 | Ht 64.0 in | Wt 162.0 lb

## 2023-03-05 DIAGNOSIS — L03116 Cellulitis of left lower limb: Secondary | ICD-10-CM | POA: Diagnosis not present

## 2023-03-05 NOTE — Patient Instructions (Addendum)
PROVIDE NOTE TO RETURN TO WORK TONIGHT. ALSO INCLUDE PATIENT NEEDS TO BE ALLOWED TIME TO ICE THE KNEE AS NEEDED THROUGH OUT THE SHIFT.   RETURN IN 1 WEEK FOR FOLLOW UP  CONTINUE TAKING THE ANTIBIOTIC UNTIL IT IS FINISHED.   IF THE KNEE GETS WORSE, THE SWELLING COMES BACK,OR PAIN INCREASES, GO TO THE EMERGENCY ROOM TO BE SEEN  DR.KEELING'S SCHEDULE IS AS FOLLOWS: TUESDAY: ALL DAY WEDNESDAY: MORNING ONLY THURSDAY: MORNING ONLY PLEASE CALL OR SEND A MESSAGE VIA MYCHART BY WEDNESDAY MORNING SO THAT I CAN SEND A REQUEST TO HIM. HE LEAVES THURSDAY BY 11:30AM. HE DOES NOT RETURN TO THE OFFICE UNTIL TUESDAY MORNINGS AND HE DOES NOT CHECK HIS WORK MESSAGES DURING HIS TIME AWAY FROM THE OFFICE.   YOU MAY RETURN TO WORK TONIGHT

## 2023-03-05 NOTE — Progress Notes (Signed)
I have cellulitis of my left knee.  She fell and hurt her left knee last week.  She developed redness and swelling.  She went to the ER on 02-29-20.  They evaluated her, took X-rays and told her she had cellulitis of the left knee.  They gave her IV vancomycin and then oral doxycycline to take. She is better now.  She has much less swelling and only minimal redness now.  Her pain is much improved.  She is afebrile.   She is also a diabetic and her last A1C was 6.0.  Left knee has some slight dark redness, slight soft tissue swelling over mid patella area, full ROM, no increased heat and no wound or drainage.  I have reviewed the pictures taken in the ER and she is much improved.  I have independently reviewed and interpreted x-rays of this patient done at another site by another physician or qualified health professional.  I have reviewed the ER records as well.  Encounter Diagnosis  Name Primary?   Cellulitis of left knee Yes   Continue the doxycycline.  Return in one week.  She can return to work.  If she has any problem or thinks she has a change, go to the ER.  This is Easter week and the office will be on less staffing.  Call if any problem.  Precautions discussed.  Electronically Signed Sanjuana Kava, MD 3/26/202410:33 AM

## 2023-03-06 ENCOUNTER — Encounter (HOSPITAL_COMMUNITY): Payer: Self-pay | Admitting: Emergency Medicine

## 2023-03-06 ENCOUNTER — Emergency Department (HOSPITAL_COMMUNITY): Payer: Managed Care, Other (non HMO)

## 2023-03-06 ENCOUNTER — Other Ambulatory Visit: Payer: Self-pay

## 2023-03-06 ENCOUNTER — Emergency Department (HOSPITAL_COMMUNITY)
Admission: EM | Admit: 2023-03-06 | Discharge: 2023-03-06 | Disposition: A | Discharge status: Home / Self Care | Payer: Managed Care, Other (non HMO) | Attending: Emergency Medicine | Admitting: Emergency Medicine

## 2023-03-06 DIAGNOSIS — E119 Type 2 diabetes mellitus without complications: Secondary | ICD-10-CM | POA: Diagnosis not present

## 2023-03-06 DIAGNOSIS — M25562 Pain in left knee: Secondary | ICD-10-CM | POA: Diagnosis present

## 2023-03-06 DIAGNOSIS — W1830XA Fall on same level, unspecified, initial encounter: Secondary | ICD-10-CM | POA: Diagnosis not present

## 2023-03-06 DIAGNOSIS — S8002XA Contusion of left knee, initial encounter: Secondary | ICD-10-CM | POA: Diagnosis not present

## 2023-03-06 DIAGNOSIS — Z7984 Long term (current) use of oral hypoglycemic drugs: Secondary | ICD-10-CM | POA: Diagnosis not present

## 2023-03-06 LAB — CBC WITH DIFFERENTIAL/PLATELET
Abs Immature Granulocytes: 0.05 10*3/uL (ref 0.00–0.07)
Basophils Absolute: 0.1 10*3/uL (ref 0.0–0.1)
Basophils Relative: 1 %
Eosinophils Absolute: 0.3 10*3/uL (ref 0.0–0.5)
Eosinophils Relative: 5 %
HCT: 40.5 % (ref 36.0–46.0)
Hemoglobin: 13.6 g/dL (ref 12.0–15.0)
Immature Granulocytes: 1 %
Lymphocytes Relative: 29 %
Lymphs Abs: 1.8 10*3/uL (ref 0.7–4.0)
MCH: 30.8 pg (ref 26.0–34.0)
MCHC: 33.6 g/dL (ref 30.0–36.0)
MCV: 91.8 fL (ref 80.0–100.0)
Monocytes Absolute: 0.4 10*3/uL (ref 0.1–1.0)
Monocytes Relative: 6 %
Neutro Abs: 3.6 10*3/uL (ref 1.7–7.7)
Neutrophils Relative %: 58 %
Platelets: 199 10*3/uL (ref 150–400)
RBC: 4.41 MIL/uL (ref 3.87–5.11)
RDW: 12.9 % (ref 11.5–15.5)
WBC: 6.2 10*3/uL (ref 4.0–10.5)
nRBC: 0 % (ref 0.0–0.2)

## 2023-03-06 NOTE — Discharge Instructions (Addendum)
Today showed that you do not have an elevated white count, continue to take your antibiotics for your cellulitis.  Additionally you have no effusion.  You do have bruising along your knee, this is likely secondary to the fall that occurred last week.  Your pain is likely also secondary to this.  If your knee becomes red hot swollen or worsening in pain please return to the ER.  Try to elevate your leg and ice to help ease the pain.

## 2023-03-06 NOTE — ED Triage Notes (Signed)
Pt was seen last week for cellulitis of left leg. Pt followed up with orthopedic yesterday and was cleared to go to work but told to return to ER if pain worsen. Pt went to work last night and today is having worsening pain in left knee. No redness or warmth noted to left knee.

## 2023-03-06 NOTE — ED Provider Notes (Signed)
[facility] Provider Note   CSN: [id number] Arrival date & time: 03/06/23  2117     History  Chief Complaint  Patient presents with   Leg Pain    Christy Russo is a 57 y.o. female, history of diabetes, who presents to the ED secondary to left knee pain for the last day.  She states that last week she fell on it, and had some bruising, and it was swollen, was diagnosed with cellulitis, and with standing antibiotics and it was improving.  Went to the orthopedics doctor yesterday, and was given clearance for work, and she states that last night she standing on it all night, and she has had worsening pain since then.  She states that it is not more swollen or red, but is concerned that she may have worsening infection.  Denies any numbness or tingling.  States its yellow around the knee.   Home Medications Prior to Admission medications   Medication Sig Start Date End Date Taking? Authorizing Provider  acetaminophen (TYLENOL) 500 MG tablet Take 1,000 mg by mouth as needed for moderate pain.    [provider]  ALPRAZolam Duanne Moron) 1 MG tablet Take 1 mg by mouth 4 (four) times daily as needed for anxiety. 08/30/20   [provider]  doxycycline (VIBRAMYCIN) 100 MG capsule Take 1 capsule (100 mg total) by mouth 2 (two) times daily. 03/01/23   Evalee Jefferson, PA-C  DULoxetine (CYMBALTA) 60 MG capsule Take 60 mg by mouth in the morning. 08/15/20   [provider]  folic acid (FOLVITE) 1 MG tablet Take 1 mg by mouth daily. 11/25/21   [provider]  losartan (COZAAR) 50 MG tablet Take 50 mg by mouth in the morning. 09/19/20   [provider]  metFORMIN (GLUCOPHAGE-XR) 500 MG 24 hr tablet Take 500 mg by mouth daily at 6 (six) AM. 09/19/20   [provider]  naproxen (NAPROSYN) 500 MG tablet Take 1 tablet (500 mg total) by mouth 2 (two) times daily. 03/01/23   Evalee Jefferson, PA-C  omeprazole (PRILOSEC) 20  MG capsule Take 20 mg by mouth in the morning. 02/20/17   [provider]  rosuvastatin (CRESTOR) 20 MG tablet Take 20 mg by mouth daily. 11/25/21   [provider]  zolpidem (AMBIEN) 10 MG tablet Take 10 mg by mouth at bedtime. 11/14/21   [provider]      Allergies    Patient has no known allergies.    Review of Systems   Review of Systems  Musculoskeletal:        +L knee pain  Skin:  Negative for wound.    Physical Exam Updated Vital Signs BP (!) 135/58   Pulse 82   Temp 98 F (36.7 C) (Oral)   Resp 16   Ht 5\' 4"  (1.626 m)   Wt 73 kg   SpO2 96%   BMI 27.62 kg/m  Physical Exam Vitals and nursing note reviewed.  Constitutional:      General: She is not in acute distress.    Appearance: She is well-developed.  HENT:     Head: Normocephalic and atraumatic.  Eyes:     General:        Right eye: No discharge.        Left eye: No discharge.     Conjunctiva/sclera: Conjunctivae normal.  Pulmonary:     Effort: No respiratory distress.  Musculoskeletal:     Comments: Left  Knee: Diffuse tenderness to palpation of knee, no focal ttp. An effusion is not present.  Negative anterior and posterior drawer. Negative Mcmurray's. +Patellar stability. Negative valgus and varus stress test.. Extension and flexion intact. No sensory deficits.    Skin:    Comments: +yellowing ecchymoses along L patella, medial and lateral knee  Neurological:     Mental Status: She is alert.     Comments: Clear speech.   Psychiatric:        Behavior: Behavior normal.        Thought Content: Thought content normal.     ED Results / Procedures / Treatments   Labs (all labs ordered are listed, but only abnormal results are displayed) Labs Reviewed  CBC WITH DIFFERENTIAL/PLATELET    EKG None  Radiology DG Knee Complete 4 Views Left  Result Date: 03/06/2023 CLINICAL DATA:  Pain and trauma EXAM: LEFT KNEE - COMPLETE 4+ VIEW COMPARISON:  Left knee x-ray 03/01/2023  FINDINGS: No evidence of fracture, dislocation, or joint effusion. No evidence of arthropathy or other focal bone abnormality. Soft tissues are unremarkable. IMPRESSION: Negative. Electronically Signed   By: Ronney Asters M.D.   On: 03/06/2023 22:24    Procedures Procedures    Medications Ordered in ED Medications - No data to display  ED Course/ Medical Decision Making/ A&P                             Medical Decision Making Patient is a 57 year old female, she is here, for worsening left knee pain.  She has some ecchymosis along her knee, no laxity.  She is concerned it may be infected.  There is no effusion, but we discussed and we will obtain a CBC, and an x-ray for further evaluation.  She has no swelling of her knee either or leg.  Amount and/or Complexity of Data Reviewed Labs: ordered.    Details: Normal WBC Radiology: ordered.    Details: X-ray unremarkable Discussion of management or test interpretation with external provider(s): Discussed with patient, normal x-ray, normal white blood cell count, I believe that her pain is likely secondary from the ecchymosis and standing on it all night.  I gave her a work note, and Ace wrap her leg.  We discussed return precautions and concerning symptoms of a possible septic joint, versus other ED return ER symptoms.  She voiced understanding.  Discharged.  Final Clinical Impression(s) / ED Diagnoses Final diagnoses:  Contusion of left knee, initial encounter    Rx / DC Orders ED Discharge Orders     None         Beverley Sherrard, Si Gaul, PA 03/06/23 2243    Milton Ferguson, MD 03/07/23 1039

## 2023-03-10 ENCOUNTER — Other Ambulatory Visit: Payer: Self-pay

## 2023-03-10 ENCOUNTER — Emergency Department (HOSPITAL_COMMUNITY)
Admission: EM | Admit: 2023-03-10 | Discharge: 2023-03-10 | Disposition: A | Discharge status: Home / Self Care | Payer: Managed Care, Other (non HMO) | Attending: Emergency Medicine | Admitting: Emergency Medicine

## 2023-03-10 ENCOUNTER — Encounter (HOSPITAL_COMMUNITY): Payer: Self-pay | Admitting: *Deleted

## 2023-03-10 DIAGNOSIS — L03116 Cellulitis of left lower limb: Secondary | ICD-10-CM | POA: Diagnosis not present

## 2023-03-10 DIAGNOSIS — M79662 Pain in left lower leg: Secondary | ICD-10-CM

## 2023-03-10 DIAGNOSIS — Z7984 Long term (current) use of oral hypoglycemic drugs: Secondary | ICD-10-CM | POA: Diagnosis not present

## 2023-03-10 DIAGNOSIS — E119 Type 2 diabetes mellitus without complications: Secondary | ICD-10-CM | POA: Diagnosis not present

## 2023-03-10 DIAGNOSIS — I10 Essential (primary) hypertension: Secondary | ICD-10-CM | POA: Insufficient documentation

## 2023-03-10 HISTORY — DX: Essential (primary) hypertension: I10

## 2023-03-10 LAB — CBC WITH DIFFERENTIAL/PLATELET
Abs Immature Granulocytes: 0.03 10*3/uL (ref 0.00–0.07)
Basophils Absolute: 0.1 10*3/uL (ref 0.0–0.1)
Basophils Relative: 1 %
Eosinophils Absolute: 0.3 10*3/uL (ref 0.0–0.5)
Eosinophils Relative: 4 %
HCT: 40 % (ref 36.0–46.0)
Hemoglobin: 13.5 g/dL (ref 12.0–15.0)
Immature Granulocytes: 1 %
Lymphocytes Relative: 31 %
Lymphs Abs: 1.9 10*3/uL (ref 0.7–4.0)
MCH: 31 pg (ref 26.0–34.0)
MCHC: 33.8 g/dL (ref 30.0–36.0)
MCV: 91.7 fL (ref 80.0–100.0)
Monocytes Absolute: 0.4 10*3/uL (ref 0.1–1.0)
Monocytes Relative: 6 %
Neutro Abs: 3.5 10*3/uL (ref 1.7–7.7)
Neutrophils Relative %: 57 %
Platelets: 198 10*3/uL (ref 150–400)
RBC: 4.36 MIL/uL (ref 3.87–5.11)
RDW: 12.6 % (ref 11.5–15.5)
WBC: 6.1 10*3/uL (ref 4.0–10.5)
nRBC: 0 % (ref 0.0–0.2)

## 2023-03-10 LAB — COMPREHENSIVE METABOLIC PANEL
ALT: 45 U/L — ABNORMAL HIGH (ref 0–44)
AST: 60 U/L — ABNORMAL HIGH (ref 15–41)
Albumin: 4 g/dL (ref 3.5–5.0)
Alkaline Phosphatase: 75 U/L (ref 38–126)
Anion gap: 9 (ref 5–15)
BUN: 14 mg/dL (ref 6–20)
CO2: 26 mmol/L (ref 22–32)
Calcium: 8.7 mg/dL — ABNORMAL LOW (ref 8.9–10.3)
Chloride: 101 mmol/L (ref 98–111)
Creatinine, Ser: 0.68 mg/dL (ref 0.44–1.00)
GFR, Estimated: >60 mL/min (ref >60)
Glucose, Bld: 199 mg/dL — ABNORMAL HIGH (ref 70–99)
Potassium: 3.4 mmol/L — ABNORMAL LOW (ref 3.5–5.1)
Sodium: 136 mmol/L (ref 135–145)
Total Bilirubin: 0.9 mg/dL (ref 0.3–1.2)
Total Protein: 6.4 g/dL — ABNORMAL LOW (ref 6.5–8.1)

## 2023-03-10 LAB — LACTIC ACID, PLASMA
Lactic Acid, Venous: 1.9 mmol/L (ref 0.5–1.9)
Lactic Acid, Venous: 1.5 mmol/L (ref 0.5–1.9)

## 2023-03-10 MED ORDER — DEXTROSE 5 % IV SOLN
1500.0000 mg | Freq: Once | INTRAVENOUS | Status: AC
  Administered 2023-03-10: 1500 mg via INTRAVENOUS
  Filled 2023-03-10: qty 75

## 2023-03-10 NOTE — ED Notes (Signed)
Received medication to administer

## 2023-03-10 NOTE — ED Triage Notes (Signed)
Pt dx'd with cellulitis of left leg recently and has one more dose PO antibiotic.  Pt states with continued redness and swelling to left ankle. Today began to have right ankle swelling and redness to right lower leg as well.

## 2023-03-10 NOTE — Discharge Instructions (Signed)
Treated today with one-time dose of Dalvance, this is a strong antibiotic that should treat an underlying cellulitis.  Return tomorrow to the emergency department at 9 AM to have ultrasound done, check in at the front unable to do the ultrasound with extension at today's visit.  This is to rule out a blood clot.  Follow-up with your primary on Tuesday as planned.  Return to the ED for fevers, chest pain, shortness of breath, new or concerning symptoms.

## 2023-03-10 NOTE — ED Notes (Signed)
AC made aware of medication administration.

## 2023-03-10 NOTE — ED Provider Notes (Signed)
[facility] Provider Note   CSN: [id number] Arrival date & time: 03/10/23  1735     History  Chief Complaint  Patient presents with   Cellulitis    Christy Russo is a 57 y.o. female.  HPI   Patient is a 57 year old female with history of hypertension, hyperlipidemia, diabetes presenting to the emergency department due to concerns for cellulitis.  Patient was initially here 03/01/2023, started on doxycycline.  She has been taking that, initially there is erythema over the knee but that has resolved. Yesterday she noticed erythema to right lower extremity around ankle, it has been spreading distally.  This has been spreading.  It worse in the last few days.  No fevers or chills at home, it is slightly painful to touch.  Home Medications Prior to Admission medications   Medication Sig Start Date End Date Taking? Authorizing Provider  acetaminophen (TYLENOL) 500 MG tablet Take 1,000 mg by mouth as needed for moderate pain.    [provider]  ALPRAZolam Duanne Moron) 1 MG tablet Take 1 mg by mouth 4 (four) times daily as needed for anxiety. 08/30/20   [provider]  doxycycline (VIBRAMYCIN) 100 MG capsule Take 1 capsule (100 mg total) by mouth 2 (two) times daily. 03/01/23   Evalee Jefferson, PA-C  DULoxetine (CYMBALTA) 60 MG capsule Take 60 mg by mouth in the morning. 08/15/20   [provider]  folic acid (FOLVITE) 1 MG tablet Take 1 mg by mouth daily. 11/25/21   [provider]  losartan (COZAAR) 50 MG tablet Take 50 mg by mouth in the morning. 09/19/20   [provider]  metFORMIN (GLUCOPHAGE-XR) 500 MG 24 hr tablet Take 500 mg by mouth daily at 6 (six) AM. 09/19/20   [provider]  naproxen (NAPROSYN) 500 MG tablet Take 1 tablet (500 mg total) by mouth 2 (two) times daily. 03/01/23   Evalee Jefferson, PA-C  omeprazole (PRILOSEC) 20 MG capsule Take 20 mg by mouth in the morning. 02/20/17   [provider]  rosuvastatin (CRESTOR) 20 MG tablet Take 20 mg by mouth daily. 11/25/21   [provider]  zolpidem (AMBIEN) 10 MG tablet Take 10 mg by mouth at bedtime. 11/14/21   [provider]      Allergies    Patient has no known allergies.    Review of Systems   Review of Systems  Physical Exam Updated Vital Signs BP (!) 179/93   Pulse 88   Temp 98.6 F (37 C) (Oral)   Resp 16   Ht 5\' 4"  (1.626 m)   Wt 72.6 kg   SpO2 97%   BMI 27.46 kg/m  Physical Exam Vitals and nursing note reviewed. Exam conducted with a chaperone present.  Constitutional:      General: She is not in acute distress.    Appearance: Normal appearance.  HENT:     Head: Normocephalic and atraumatic.  Eyes:     General: No scleral icterus.    Extraocular Movements: Extraocular movements intact.     Pupils: Pupils are equal, round, and reactive to light.  Cardiovascular:     Pulses: Normal pulses.  Skin:    Capillary Refill: Capillary refill takes less than 2 seconds.     Coloration: Skin is not jaundiced.     Findings: Erythema present.  Neurological:     Mental Status: She is alert. Mental status is at baseline.     Coordination: Coordination  normal.        ED Results / Procedures / Treatments   Labs (all labs ordered are listed, but only abnormal results are displayed) Labs Reviewed  COMPREHENSIVE METABOLIC PANEL - Abnormal; Notable for the following components:      Result Value   Potassium 3.4 (*)    Glucose, Bld 199 (*)    Calcium 8.7 (*)    Total Protein 6.4 (*)    AST 60 (*)    ALT 45 (*)    All other components within normal limits  CBC WITH DIFFERENTIAL/PLATELET  LACTIC ACID, PLASMA  LACTIC ACID, PLASMA    EKG None  Radiology No results found.  Procedures Procedures    Medications Ordered in ED Medications  dalbavancin (DALVANCE) 1,500 mg in dextrose 5 % 500 mL IVPB (has no administration in time range)    ED Course/ Medical Decision  Making/ A&P                             Medical Decision Making Amount and/or Complexity of Data Reviewed Labs: ordered.   Patient presents due to left lower extremity erythema and calf pain.  The erythema streaks up to her calf, is mostly on the ankle over the left lower extremity.  She has palpable pulses, good cap refill.  I do not think this is an ischemic limb, compartments are also soft.  She ranges all the joints without any difficulty, there is no extension over the joint space so I do not suspect a septic joint.    She is currently on an antibiotic for previous cellulitis but this started yesterday so it seems like a new infection.  Will check labs and reassess.  There is no leukocytosis, no evidence of DKA, lactic is within normal limits.  Clinically, patient has no evidence of sepsis.  I considered admission for IV antibiotics but this is a new infection rather than a failure of outpatient antibiotics.  Additionally she meets no SIRS criteria, not septic and was started acutely yesterday.  For those reasons I do not think she needs admission today. Will give one-time dose of Dalvance here in the ED.   DVT is a consideration, ultrasound is not available this evening so she will come tomorrow to have DVT study to rule out clot.   Patient has a follow-up appoint with her primary care doctor scheduled from 3 days from now.  I encouraged her to keep that appointment for reevaluation.  After Dalvance patient is stable for discharge.  The plan was discussed extensively with the patient, she is in agreement the plan and verbalized understanding.  Discussed HPI, physical exam and plan of care for this patient with attending Sherwood Gambler. The attending physician evaluated this patient as part of a shared visit and agrees with plan of care.         Final Clinical Impression(s) / ED Diagnoses Final diagnoses:  Cellulitis of left lower extremity  Pain of left calf    Rx / DC  Orders ED Discharge Orders          Ordered    Ambulatory referral to Infectious Disease       Comments: Cellulitis patient:  Received dalbavancin on 03/10/2023.   03/10/23 2010    US Venous Img Lower Unilateral Left        03/10/23 2041              Sherrill Raring, PA-C  03/10/23 2045    Sherwood Gambler, MD 03/11/23 1531

## 2023-03-11 ENCOUNTER — Ambulatory Visit (HOSPITAL_COMMUNITY)
Admission: RE | Admit: 2023-03-11 | Discharge: 2023-03-11 | Disposition: A | Discharge status: Home / Self Care | Payer: Managed Care, Other (non HMO) | Source: Ambulatory Visit | Attending: Student | Admitting: Student

## 2023-03-11 ENCOUNTER — Other Ambulatory Visit (HOSPITAL_COMMUNITY): Payer: Self-pay | Admitting: Student

## 2023-03-11 DIAGNOSIS — M79662 Pain in left lower leg: Secondary | ICD-10-CM | POA: Diagnosis present

## 2023-03-11 DIAGNOSIS — L03116 Cellulitis of left lower limb: Secondary | ICD-10-CM

## 2023-03-11 NOTE — ED Provider Notes (Signed)
Patient returned today for her DVT ultrasound. This study has resulted and reveals  No DVT of the left lower extremity.   I have personally reviewed and interpreted this imaging and agree with radiology interpretation.   Discussed results with patient who expressed understanding, she states that her symptoms have improved since yesterday and she has follow-up appointment with infectious disease scheduled.    Nestor Lewandowsky 03/11/23 1007    Hayden Rasmussen, MD 03/11/23 [id number]

## 2023-03-12 ENCOUNTER — Encounter: Payer: Self-pay | Admitting: Orthopaedic Surgery

## 2023-03-12 ENCOUNTER — Ambulatory Visit: Payer: Managed Care, Other (non HMO) | Admitting: Orthopaedic Surgery

## 2023-03-12 VITALS — Ht 64.0 in | Wt 154.0 lb

## 2023-03-12 DIAGNOSIS — L03116 Cellulitis of left lower limb: Secondary | ICD-10-CM

## 2023-03-12 MED ORDER — DOXYCYCLINE HYCLATE 50 MG PO CAPS: 50.0000 mg | ORAL_CAPSULE | Freq: Two times a day (BID) | ORAL | 0 refills | Status: DC

## 2023-03-12 NOTE — Addendum Note (Signed)
Addended byCandice Camp on: 03/12/2023 01:50 PM   Modules accepted: Orders

## 2023-03-12 NOTE — Progress Notes (Signed)
I had another episode.  She had swelling of both knees and the left medial ankle on Easter Sunday.  She went to the ER.  She was given IV antibiotics and is better now.    She has no pain of the right knee and the left ankle today.  The anterior part of the patella area on the left is tender but not red or swollen.  Encounter Diagnosis  Name Primary?   Cellulitis of left knee Yes   I am concerned this has happened again.  I will give samples of doxycycline 100 to take one twice a day.  She may need to see Infectious Disease if this returns.  Return in one week.  Go to ER if she has another episode and consult Infectious Disease.  Stay out of work.  Call if any problem.  Precautions discussed.  Electronically Aguila, MD 4/2/20241:46 PM

## 2023-03-12 NOTE — Patient Instructions (Signed)
OUT OF WORK ?

## 2023-03-13 ENCOUNTER — Telehealth: Payer: Self-pay | Admitting: Orthopaedic Surgery

## 2023-03-13 NOTE — Telephone Encounter (Signed)
Patient lvm on 03/12/23 stating she had a message about her visit.  I tried to call the patient back, unable to lvm, mailbox is full.

## 2023-03-14 ENCOUNTER — Telehealth: Payer: Self-pay | Admitting: Orthopaedic Surgery

## 2023-03-14 NOTE — Telephone Encounter (Signed)
FMLA forms received. To [employer]. 

## 2023-03-19 ENCOUNTER — Encounter: Payer: Self-pay | Admitting: Orthopaedic Surgery

## 2023-03-19 ENCOUNTER — Ambulatory Visit: Payer: Managed Care, Other (non HMO) | Admitting: Orthopaedic Surgery

## 2023-03-19 VITALS — BP 147/97 | HR 72 | Ht 64.0 in | Wt 154.0 lb

## 2023-03-19 DIAGNOSIS — L03116 Cellulitis of left lower limb: Secondary | ICD-10-CM

## 2023-03-19 NOTE — Progress Notes (Signed)
I am better.  She has no redness of the left knee.  She has some slight redness of the medial left ankle but no swelling.  ROM of knee and ankle is full.  NV intact.  She has three days left on the doxycycline.  Finish that up.  Encounter Diagnosis  Name Primary?   Cellulitis of left knee Yes   Return in one week.  Call if any problem.  Precautions discussed.  Electronically Signed Darreld Mclean, MD 4/9/20241:51 PM

## 2023-03-26 ENCOUNTER — Encounter: Payer: Self-pay | Admitting: Orthopaedic Surgery

## 2023-03-26 ENCOUNTER — Ambulatory Visit: Payer: Managed Care, Other (non HMO) | Admitting: Orthopaedic Surgery

## 2023-03-26 VITALS — BP 191/93 | HR 78 | Ht 64.0 in | Wt 154.0 lb

## 2023-03-26 DIAGNOSIS — L03116 Cellulitis of left lower limb: Secondary | ICD-10-CM

## 2023-03-26 NOTE — Progress Notes (Signed)
I am much better.  She has no redness of the left knee.  No redness of the left ankle area.  She is walking well.  NV intact. Full ROM of the knee on the left.  Encounter Diagnosis  Name Primary?   Cellulitis of left knee Yes   She can return to work regular.  I will see prn.  Call if any problem.  Precautions discussed.  Electronically Signed Darreld Mclean, MD 4/16/20241:27 PM

## 2023-03-26 NOTE — Addendum Note (Signed)
Addended byCaffie Damme on: 03/26/2023 01:28 PM   Modules accepted: Orders

## 2023-03-26 NOTE — Patient Instructions (Signed)
Full duty work 

## 2023-06-21 ENCOUNTER — Other Ambulatory Visit (HOSPITAL_COMMUNITY): Payer: Self-pay | Admitting: Internal Medicine

## 2023-06-21 ENCOUNTER — Ambulatory Visit (HOSPITAL_COMMUNITY)
Admission: RE | Admit: 2023-06-21 | Discharge: 2023-06-21 | Disposition: A | Discharge status: Home / Self Care | Payer: Managed Care, Other (non HMO) | Source: Ambulatory Visit | Attending: Internal Medicine | Admitting: Internal Medicine

## 2023-06-21 DIAGNOSIS — M533 Sacrococcygeal disorders, not elsewhere classified: Secondary | ICD-10-CM | POA: Diagnosis present

## 2023-07-23 ENCOUNTER — Other Ambulatory Visit: Payer: Self-pay

## 2023-07-23 ENCOUNTER — Ambulatory Visit: Payer: Managed Care, Other (non HMO) | Admitting: Orthopaedic Surgery

## 2023-07-23 DIAGNOSIS — G8929 Other chronic pain: Secondary | ICD-10-CM

## 2023-07-23 DIAGNOSIS — M5442 Lumbago with sciatica, left side: Secondary | ICD-10-CM

## 2023-07-23 NOTE — Progress Notes (Signed)
The patient is a 57 year old female that I am seeing for the first time with what is reported as left hip pain and groin pain.  However what she describes in the office today is left sided low back pain with sciatica.  There is no groin pain.  She has x-rays of her pelvis and hips as well as sacrum and lumbar spine on the canopy system from this year.  She is a thin 57 year old female.  Her primary care physician apparently has given her some oxycodone at a small amount but also meloxicam.  She lives with chronic pain and it is intermittently out of work due to this pain.  There has been no change in bowel or bladder function.  On exam I can easily move her left hip through internal and external rotation with no pain in the groin and no blocks or rotation at all.  There is no pain to ablation of the trochanteric area.  Her pain seems to be at the low lumbar spine to the left side metastatic component.  I did review all the plain films with her of her lumbar spine, her sacrum and coccyx as well as the pelvis and left hip.  All of these are normal and show normal alignment.  She does have a MRI from 2020 one of her lumbar spine that was performed by Dr. Noel Gerold spine specialist.  They had recommended physical therapy and considered injections but she deferred these.  At this point I would recommend a new MRI of her lumbar spine to help determine whether or not there is compression of nerves that are causing her left-sided sciatic and radicular symptoms so we can come up with a formal treatment plan.  She is requesting a note out of work until we "find out what is going on".  I have no further treatment recommendations until we at least see what is going on from an MRI standpoint of her lumbar spine.

## 2023-07-30 ENCOUNTER — Telehealth: Payer: Self-pay | Admitting: Orthopaedic Surgery

## 2023-07-30 NOTE — Telephone Encounter (Signed)
[employer] forms received. To [employer]. 

## 2023-08-06 ENCOUNTER — Telehealth: Payer: Self-pay | Admitting: Orthopaedic Surgery

## 2023-08-06 NOTE — Telephone Encounter (Signed)
I called pt and informed her I called Rosann Auerbach gotten an authorization

## 2023-08-06 NOTE — Telephone Encounter (Signed)
Pt came in to drop off Auth form and $50 for [employer], also [employer] denied her MRI request letter stated they could do a peer to peer to get approved please advise

## 2023-08-14 ENCOUNTER — Ambulatory Visit
Admission: RE | Admit: 2023-08-14 | Discharge: 2023-08-14 | Disposition: A | Discharge status: Home / Self Care | Payer: Managed Care, Other (non HMO) | Source: Ambulatory Visit | Attending: Orthopaedic Surgery | Admitting: Orthopaedic Surgery

## 2023-08-14 DIAGNOSIS — G8929 Other chronic pain: Secondary | ICD-10-CM

## 2023-08-21 ENCOUNTER — Ambulatory Visit: Payer: Managed Care, Other (non HMO) | Admitting: Orthopaedic Surgery

## 2023-08-21 ENCOUNTER — Encounter: Payer: Self-pay | Admitting: Orthopaedic Surgery

## 2023-08-21 ENCOUNTER — Telehealth: Payer: Self-pay | Admitting: Orthopaedic Surgery

## 2023-08-21 ENCOUNTER — Other Ambulatory Visit: Payer: Self-pay

## 2023-08-21 DIAGNOSIS — G8929 Other chronic pain: Secondary | ICD-10-CM

## 2023-08-21 DIAGNOSIS — M5442 Lumbago with sciatica, left side: Secondary | ICD-10-CM | POA: Diagnosis not present

## 2023-08-21 NOTE — Progress Notes (Signed)
The patient comes in to go over MRI of her lumbar spine.  She was having significant back pain but really radicular symptoms going down to her left hip all the way to the top of her left foot and this has been slowly getting worse.  She still has numbness and tingling the top of her left foot on exam today and a positive straight leg raise.  She is walking without a limp but does appear uncomfortable.  The MRI does show left foraminal narrowing at L5-S1 secondary to moderate facet hypertrophy.  She also still has a broad-based left foraminal disc protrusion at L2-L3.  This was compared to MRI in 2020 and it does stated that is actually improved a little since then.  Due to her continued radicular symptoms, I would like to send her to Dr. Alvester Morin for considering an epidural steroid injection to the left side at L5-S1 versus L2-L3 based on her symptoms and presentation.  She would like to try an injection as well.  We will keep her out of work until she at least has that appointment with Dr. Alvester Morin.

## 2023-08-21 NOTE — Telephone Encounter (Signed)
[employer] forms received. To [employer]. 

## 2023-08-23 ENCOUNTER — Telehealth: Payer: Self-pay | Admitting: Physical Medicine and Rehabilitation

## 2023-08-23 NOTE — Telephone Encounter (Signed)
Spoke with patient and informed her that once injection is authorized I will call her to schedule. Verbalized understanding

## 2023-08-23 NOTE — Telephone Encounter (Signed)
Attempted to call patient to inform her that injection has not been approved by insurance. VM full

## 2023-08-23 NOTE — Telephone Encounter (Signed)
Patient called wanting to know that she waiting on a called to be scheduled for injection in lower back. CB#[phone]

## 2023-09-05 ENCOUNTER — Telehealth: Payer: Self-pay

## 2023-09-05 ENCOUNTER — Ambulatory Visit: Payer: Managed Care, Other (non HMO) | Admitting: Physical Medicine and Rehabilitation

## 2023-09-05 ENCOUNTER — Telehealth: Payer: Self-pay | Admitting: Orthopaedic Surgery

## 2023-09-05 ENCOUNTER — Other Ambulatory Visit: Payer: Self-pay

## 2023-09-05 ENCOUNTER — Telehealth: Payer: Self-pay | Admitting: Physical Medicine and Rehabilitation

## 2023-09-05 VITALS — BP 193/92 | HR 87

## 2023-09-05 DIAGNOSIS — M5416 Radiculopathy, lumbar region: Secondary | ICD-10-CM

## 2023-09-05 MED ORDER — METHYLPREDNISOLONE ACETATE 80 MG/ML IJ SUSP
80.0000 mg | Freq: Once | INTRAMUSCULAR | Status: AC
  Administered 2023-09-05: 80 mg

## 2023-09-05 NOTE — Patient Instructions (Signed)

## 2023-09-05 NOTE — Telephone Encounter (Signed)
Pt called in stating she would need a not stating an exact date that she can return to work which would be 09/16/23 please fax to [phone]

## 2023-09-05 NOTE — Progress Notes (Signed)
Functional Pain Scale - descriptive words and definitions  Distressing (6)    Pain is present/unable to complete most ADLs limited by pain/sleep is difficult and active distraction is only marginal. Moderate range order  Average Pain 7   +Driver, -BT, -Dye Allergies.  Lower back pain on left side with radiation in the left leg to the top of the foot

## 2023-09-05 NOTE — Telephone Encounter (Signed)
Patient called. Says her job needs a date to return to work on her work note. Would like it faxed. She will call back with the fax number.

## 2023-09-05 NOTE — Telephone Encounter (Signed)
Faxed to provided number  

## 2023-09-06 NOTE — Progress Notes (Signed)
Christy Russo - 57 y.o. female MRN [id number]  Date of birth: Mar 14, 1966  Office Visit Note: Visit Date: 09/05/2023 PCP: Elfredia Nevins, MD Referred by: Elfredia Nevins, MD  Subjective: Chief Complaint  Patient presents with   Lower Back - Pain   HPI:  Christy Russo is a 57 y.o. female who comes in today at the request of Dr. Doneen Poisson for planned Left L5-S1 Lumbar Transforaminal epidural steroid injection with fluoroscopic guidance.  The patient has failed conservative care including home exercise, medications, time and activity modification.  This injection will be diagnostic and hopefully therapeutic.  Please see requesting physician notes for further details and justification.  Her symptoms are somewhat more of an L4-L5 dermatome down the leg but she is also getting some referral into the anterior upper thigh which could be L2 she does have a foraminal protrusion there.  Dr. Magnus Ivan did not feel like this was hip related.  She has a lot of questions today about returning to work as Dr. Magnus Ivan inhalable her out of work.  I did discuss with her that from an injection standpoint alone she could return to work from an injection standpoint but she would need to talk to him about return to work depending on how she is doing.  Consider L4 or L2 injection.   ROS Otherwise per HPI.  Assessment & Plan: Visit Diagnoses:    ICD-10-CM   1. Lumbar radiculopathy  M54.16 XR C-ARM NO REPORT    methylPREDNISolone acetate (DEPO-MEDROL) injection 80 mg    Epidural Steroid injection      Plan: No additional findings.   Meds & Orders:  Meds ordered this encounter  Medications   methylPREDNISolone acetate (DEPO-MEDROL) injection 80 mg    Orders Placed This Encounter  Procedures   XR C-ARM NO REPORT   Epidural Steroid injection    Follow-up: Return for visit to requesting provider as needed.   Procedures: No procedures performed  Lumbosacral Transforaminal  Epidural Steroid Injection - Sub-Pedicular Approach with Fluoroscopic Guidance  Patient: Christy Russo      Date of Birth: 1966-07-31 MRN: [id number] PCP: Elfredia Nevins, MD      Visit Date: 09/05/2023   Universal Protocol:    Date/Time: 09/05/2023  Consent Given By: the patient  Position: PRONE  Additional Comments: Vital signs were monitored before and after the procedure. Patient was prepped and draped in the usual sterile fashion. The correct patient, procedure, and site was verified.   Injection Procedure Details:   Procedure diagnoses: Lumbar radiculopathy [M54.16]    Meds Administered:  Meds ordered this encounter  Medications   methylPREDNISolone acetate (DEPO-MEDROL) injection 80 mg    Laterality: Left  Location/Site: L5  Needle:5.0 in., 22 ga.  Short bevel or Quincke spinal needle  Needle Placement: Transforaminal  Findings:    -Comments: Excellent flow of contrast along the nerve, nerve root and into the epidural space.  Procedure Details: After squaring off the end-plates to get a true AP view, the C-arm was positioned so that an oblique view of the foramen as noted above was visualized. The target area is just inferior to the "nose of the scotty dog" or sub pedicular. The soft tissues overlying this structure were infiltrated with 2-3 ml. of 1% Lidocaine without Epinephrine.  The spinal needle was inserted toward the target using a "trajectory" view along the fluoroscope beam.  Under AP and lateral visualization, the needle was advanced so it did not puncture dura and was located  close the 6 O'Clock position of the pedical in AP tracterory. Biplanar projections were used to confirm position. Aspiration was confirmed to be negative for CSF and/or blood. A 1-2 ml. volume of Isovue-250 was injected and flow of contrast was noted at each level. Radiographs were obtained for documentation purposes.   After attaining the desired flow of contrast documented  above, a 0.5 to 1.0 ml test dose of 0.25% Marcaine was injected into each respective transforaminal space.  The patient was observed for 90 seconds post injection.  After no sensory deficits were reported, and normal lower extremity motor function was noted,   the above injectate was administered so that equal amounts of the injectate were placed at each foramen (level) into the transforaminal epidural space.   Additional Comments:  No complications occurred Dressing: 2 x 2 sterile gauze and Band-Aid    Post-procedure details: Patient was observed during the procedure. Post-procedure instructions were reviewed.  Patient left the clinic in stable condition.    Clinical History: MRI LUMBAR SPINE WITHOUT CONTRAST   TECHNIQUE: Multiplanar, multisequence MR imaging of the lumbar spine was performed. No intravenous contrast was administered.   COMPARISON:  Radiographs 02/27/2023.  MRI 01/07/2020.   FINDINGS: Segmentation: Conventional anatomy assumed, with the last open disc space designated L5-S1.Concordant with prior imaging.   Alignment:  Stable minimal degenerative anterolisthesis at L5-S1.   Vertebrae: No worrisome osseous lesion, acute fracture or pars defect. The visualized sacroiliac joints appear unremarkable.   Conus medullaris: Extends to the L1 level and appears normal.   Paraspinal and other soft tissues: No significant paraspinal findings. Small renal cysts are noted bilaterally for which no specific follow-up imaging is recommended. 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   Disc levels:   Sagittal images demonstrate no significant disc space findings within the visualized lower thoracic spine.   L1-2: Normal interspace.   L2-3: Stable mild loss of disc height with annular disc bulging eccentric to the left and a broad-based left foraminal disc protrusion. There is improved patency of the left lateral recess and left foramen without definite  residual nerve root encroachment. The spinal canal is patent.   L3-4: Stable mild loss of disc height with disc bulging and mild bilateral facet hypertrophy. No significant spinal stenosis or nerve root encroachment.   L4-5: Preserved disc height with stable mild disc bulging, facet and ligamentous hypertrophy. No significant spinal stenosis or nerve root encroachment.   L5-S1: Stable mild loss of disc height with annular disc bulging eccentric to the left and a left annular fissure. Mildly progressive moderate facet hypertrophy accounting for the grade 1 anterolisthesis. Left foraminal narrowing has mildly increased but appears only mild-to-moderate. The left lateral recess is mildly narrowed. The spinal canal, right lateral recess and right foramen are patent.   IMPRESSION: 1. Compared with the previous MRI from 2020, there is mildly progressive facet hypertrophy at L5-S1 with resulting mild-to-moderate left foraminal narrowing and mild narrowing of the left lateral recess. 2. Interval improvement in left lateral recess and left foraminal narrowing at L2-3 without definite residual nerve root encroachment. 3. No other significant spinal stenosis or nerve root encroachment.     Electronically Signed   By: Carey Bullocks M.D.   On: 08/20/2023 16:02     Objective:  VS:  HT:    WT:   BMI:     BP:(!) 193/92  HR:87bpm  TEMP: ( )  RESP:  Physical Exam Vitals and nursing note reviewed.  Constitutional:  General: She is not in acute distress.    Appearance: Normal appearance. She is not ill-appearing.  HENT:     Head: Normocephalic and atraumatic.     Right Ear: External ear normal.     Left Ear: External ear normal.  Eyes:     Extraocular Movements: Extraocular movements intact.  Cardiovascular:     Rate and Rhythm: Normal rate.     Pulses: Normal pulses.  Pulmonary:     Effort: Pulmonary effort is normal. No respiratory distress.  Abdominal:     General:  There is no distension.     Palpations: Abdomen is soft.  Musculoskeletal:        General: Tenderness present.     Cervical back: Neck supple.     Right lower leg: No edema.     Left lower leg: No edema.     Comments: Patient has good distal strength with no pain over the greater trochanters.  No clonus or focal weakness.  Skin:    Findings: No erythema, lesion or rash.  Neurological:     General: No focal deficit present.     Mental Status: She is alert and oriented to person, place, and time.     Sensory: No sensory deficit.     Motor: No weakness or abnormal muscle tone.     Coordination: Coordination normal.  Psychiatric:        Mood and Affect: Mood normal.        Behavior: Behavior normal.      Imaging: XR C-ARM NO REPORT  Result Date: 09/05/2023 Please see Notes tab for imaging impression.

## 2023-09-06 NOTE — Procedures (Signed)
Lumbosacral Transforaminal Epidural Steroid Injection - Sub-Pedicular Approach with Fluoroscopic Guidance  Patient: Christy Russo      Date of Birth: August 03, 1966 MRN: [id number] PCP: Elfredia Nevins, MD      Visit Date: 09/05/2023   Universal Protocol:    Date/Time: 09/05/2023  Consent Given By: the patient  Position: PRONE  Additional Comments: Vital signs were monitored before and after the procedure. Patient was prepped and draped in the usual sterile fashion. The correct patient, procedure, and site was verified.   Injection Procedure Details:   Procedure diagnoses: Lumbar radiculopathy [M54.16]    Meds Administered:  Meds ordered this encounter  Medications   methylPREDNISolone acetate (DEPO-MEDROL) injection 80 mg    Laterality: Left  Location/Site: L5  Needle:5.0 in., 22 ga.  Short bevel or Quincke spinal needle  Needle Placement: Transforaminal  Findings:    -Comments: Excellent flow of contrast along the nerve, nerve root and into the epidural space.  Procedure Details: After squaring off the end-plates to get a true AP view, the C-arm was positioned so that an oblique view of the foramen as noted above was visualized. The target area is just inferior to the "nose of the scotty dog" or sub pedicular. The soft tissues overlying this structure were infiltrated with 2-3 ml. of 1% Lidocaine without Epinephrine.  The spinal needle was inserted toward the target using a "trajectory" view along the fluoroscope beam.  Under AP and lateral visualization, the needle was advanced so it did not puncture dura and was located close the 6 O'Clock position of the pedical in AP tracterory. Biplanar projections were used to confirm position. Aspiration was confirmed to be negative for CSF and/or blood. A 1-2 ml. volume of Isovue-250 was injected and flow of contrast was noted at each level. Radiographs were obtained for documentation purposes.   After attaining the desired  flow of contrast documented above, a 0.5 to 1.0 ml test dose of 0.25% Marcaine was injected into each respective transforaminal space.  The patient was observed for 90 seconds post injection.  After no sensory deficits were reported, and normal lower extremity motor function was noted,   the above injectate was administered so that equal amounts of the injectate were placed at each foramen (level) into the transforaminal epidural space.   Additional Comments:  No complications occurred Dressing: 2 x 2 sterile gauze and Band-Aid    Post-procedure details: Patient was observed during the procedure. Post-procedure instructions were reviewed.  Patient left the clinic in stable condition.

## 2023-09-09 NOTE — Telephone Encounter (Signed)
Letter placed in MyChart

## 2023-09-19 ENCOUNTER — Telehealth: Payer: Self-pay | Admitting: Physical Medicine and Rehabilitation

## 2023-09-19 NOTE — Telephone Encounter (Signed)
Patient called and said she got her injection two weeks ago and she is still in pain. CB#[phone]

## 2023-09-20 ENCOUNTER — Encounter: Payer: Self-pay | Admitting: Physical Medicine and Rehabilitation

## 2023-09-20 NOTE — Telephone Encounter (Signed)
Spoke with patient and she stated the injection lasted a week. She wanted to be seen again. OV scheduled 09/23/23. She needs a note faxed to her job saying she has been out since 09/18/23 due to back pain and to return on 09/24/23  Fax#: [phone] Attn: Beverely Pace

## 2023-09-20 NOTE — Telephone Encounter (Signed)
[facility]ow note faxed per pts request

## 2023-09-23 ENCOUNTER — Telehealth: Payer: Self-pay | Admitting: Physical Medicine and Rehabilitation

## 2023-09-23 ENCOUNTER — Telehealth: Payer: Self-pay | Admitting: Orthopaedic Surgery

## 2023-09-23 ENCOUNTER — Encounter: Payer: Self-pay | Admitting: Physical Medicine and Rehabilitation

## 2023-09-23 ENCOUNTER — Ambulatory Visit: Payer: Managed Care, Other (non HMO) | Admitting: Physical Medicine and Rehabilitation

## 2023-09-23 VITALS — BP 168/89 | HR 71

## 2023-09-23 DIAGNOSIS — M25552 Pain in left hip: Secondary | ICD-10-CM | POA: Diagnosis not present

## 2023-09-23 DIAGNOSIS — M7918 Myalgia, other site: Secondary | ICD-10-CM | POA: Diagnosis not present

## 2023-09-23 DIAGNOSIS — M5416 Radiculopathy, lumbar region: Secondary | ICD-10-CM | POA: Diagnosis not present

## 2023-09-23 NOTE — Telephone Encounter (Signed)
[employer] forms received. To [employer].

## 2023-09-23 NOTE — Telephone Encounter (Signed)
Spoke with patient, she requests the revised [place] form from 09/10/23 be refaxed, I refaxed to [facility] [phone]

## 2023-09-23 NOTE — Progress Notes (Unsigned)
Christy Russo - 57 y.o. female MRN [id number]  Date of birth: 1966/09/05  Office Visit Note: Visit Date: 09/23/2023 PCP: Elfredia Nevins, MD Referred by: Elfredia Nevins, MD  Subjective: Chief Complaint  Patient presents with   Lower Back - Pain, Follow-up   HPI: Christy Russo is a 57 y.o. female who comes in today for evaluation of chronic, worsening and severe buttock pain radiating to left hip, groin and medal aspect of left thigh down to knee. Pain ongoing for 6 months, worsens with prolonged sitting and standing. Laying flat on back seems to help alleviate pain. She describes her pain as constant aching sensation, states buttock pain feels more like a stabbing sensation, currently rates as 8 out of 10. Some relief of pain with home exercise regimen, rest and use of medications. History of formal physical therapy about 4 years ago with some relief of pain. Prior surgical consult with Dr. Sharolyn Douglas with [facility], she did not pursue surgical intervention at that time.   She was previously evaluated by Dr. Doneen Poisson, per his notes her symptoms did radiate down entire left leg at that time. She recently underwent left L4 transforaminal epidural steroid injection in our office on 09/05/2023, she reports good relief of left lower leg symptoms, however she continues to have severe pain especially to buttocks and left hip/groin. Recent lumbar MRI imaging exhibits broad-based left foraminal disc protrusion at L2-L3, there is improvement of both left lateral recess and foraminal stenosis at this level since 2020. There is moderate facet arthropathy and grade 1 anterolisthesis at L5-S1. No high grade spinal canal stenosis noted. Recent left hip and coccyx x-rays are negative for any concerning findings. Patient denies focal weakness, numbness and tingling. No recent trauma or falls.    Review of Systems  Musculoskeletal:  Positive for back pain.   Neurological:  Negative for tingling, sensory change, focal weakness and weakness.  All other systems reviewed and are negative.  Otherwise per HPI.  Assessment & Plan: Visit Diagnoses:    ICD-10-CM   1. Lumbar radiculopathy  M54.16 Ambulatory referral to Physical Medicine Rehab    2. Buttock pain  M79.18 Ambulatory referral to Physical Medicine Rehab    3. Pain in left hip  M25.552 Ambulatory referral to Physical Medicine Rehab       Plan: Findings:  Chronic, worsening and severe buttock pain radiating to left hip, groin and medal aspect of left thigh down to knee. Left lower leg symptoms have completely resolved with recent left L4 transforaminal epidural steroid injection, however she continues to have severe pain. She continues with conservative therapies such as home exercise regimen, rest and use of medications. Patients clinical presentation and exam are consistent with L2 nerve pattern. I also feel there could be a type of central sensitization syndrome such as fibromyalgia contributing to to her symptoms.  Next step is to perform left L2 transforaminal epidural steroid injection under fluoroscopic guidance. If good relief of pain with injection we can repeat this procedure infrequently as needed. If her pain persists other options include surgical consultation, chronic pain management and re-grouping with physical therapy. No red flag symptoms noted upon exam today.     Meds & Orders: No orders of the defined types were placed in this encounter.   Orders Placed This Encounter  Procedures   Ambulatory referral to Physical Medicine Rehab    Follow-up: Return for Left L2 transforaminal epidural steroid injection.   Procedures: No procedures  performed      Clinical History: MRI LUMBAR SPINE WITHOUT CONTRAST   TECHNIQUE: Multiplanar, multisequence MR imaging of the lumbar spine was performed. No intravenous contrast was administered.   COMPARISON:  Radiographs 02/27/2023.   MRI 01/07/2020.   FINDINGS: Segmentation: Conventional anatomy assumed, with the last open disc space designated L5-S1.Concordant with prior imaging.   Alignment:  Stable minimal degenerative anterolisthesis at L5-S1.   Vertebrae: No worrisome osseous lesion, acute fracture or pars defect. The visualized sacroiliac joints appear unremarkable.   Conus medullaris: Extends to the L1 level and appears normal.   Paraspinal and other soft tissues: No significant paraspinal findings. Small renal cysts are noted bilaterally for which no specific follow-up imaging is recommended. 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   Disc levels:   Sagittal images demonstrate no significant disc space findings within the visualized lower thoracic spine.   L1-2: Normal interspace.   L2-3: Stable mild loss of disc height with annular disc bulging eccentric to the left and a broad-based left foraminal disc protrusion. There is improved patency of the left lateral recess and left foramen without definite residual nerve root encroachment. The spinal canal is patent.   L3-4: Stable mild loss of disc height with disc bulging and mild bilateral facet hypertrophy. No significant spinal stenosis or nerve root encroachment.   L4-5: Preserved disc height with stable mild disc bulging, facet and ligamentous hypertrophy. No significant spinal stenosis or nerve root encroachment.   L5-S1: Stable mild loss of disc height with annular disc bulging eccentric to the left and a left annular fissure. Mildly progressive moderate facet hypertrophy accounting for the grade 1 anterolisthesis. Left foraminal narrowing has mildly increased but appears only mild-to-moderate. The left lateral recess is mildly narrowed. The spinal canal, right lateral recess and right foramen are patent.   IMPRESSION: 1. Compared with the previous MRI from 2020, there is mildly progressive facet hypertrophy at L5-S1 with  resulting mild-to-moderate left foraminal narrowing and mild narrowing of the left lateral recess. 2. Interval improvement in left lateral recess and left foraminal narrowing at L2-3 without definite residual nerve root encroachment. 3. No other significant spinal stenosis or nerve root encroachment.     Electronically Signed   By: Carey Bullocks M.D.   On: 08/20/2023 16:02   She reports that she quit smoking about 6 months ago. Her smoking use included cigarettes. She has never used smokeless tobacco. No results for input(s): "HGBA1C", "LABURIC" in the last 8760 hours.  Objective:  VS:  HT:    WT:   BMI:     BP:(!) 168/89  HR:71bpm  TEMP: ( )  RESP:  Physical Exam Vitals and nursing note reviewed.  HENT:     Head: Normocephalic and atraumatic.     Right Ear: External ear normal.     Left Ear: External ear normal.     Nose: Nose normal.     Mouth/Throat:     Mouth: Mucous membranes are moist.  Eyes:     Extraocular Movements: Extraocular movements intact.  Cardiovascular:     Rate and Rhythm: Normal rate.     Pulses: Normal pulses.  Pulmonary:     Effort: Pulmonary effort is normal.  Abdominal:     General: Abdomen is flat. There is no distension.  Musculoskeletal:        General: Tenderness present.     Cervical back: Normal range of motion.     Comments: Patient rises from seated position to standing without  difficulty. Good lumbar range of motion. No pain noted with facet loading. 5/5 strength noted with bilateral hip flexion, knee flexion/extension, ankle dorsiflexion/plantarflexion and EHL. No clonus noted bilaterally. No pain upon palpation of greater trochanters. No pain with internal/external rotation of bilateral hips. Sensation intact bilaterally. Dysesthesias noted to left L2 dermatome. Negative slump test bilaterally. Ambulates without aid, gait steady.     Skin:    General: Skin is warm and dry.     Capillary Refill: Capillary refill takes less than 2  seconds.  Neurological:     General: No focal deficit present.     Mental Status: She is alert and oriented to person, place, and time.  Psychiatric:        Mood and Affect: Mood normal.        Behavior: Behavior normal.     Ortho Exam  Imaging: No results found.  Past Medical/Family/Surgical/Social History: Medications & Allergies reviewed per EMR, new medications updated. Patient Active Problem List   Diagnosis Date Noted   Encounter for screening colonoscopy 05/05/2019   Symptomatic cholelithiasis 01/14/2018   Confusion, postoperative 03/16/2017   Post-operative state 04/24/2016   Depression, major, recurrent, moderate (HCC) 04/15/2016   GAD (generalized anxiety disorder) 04/15/2016   Benzodiazepine dependence, continuous (HCC) 04/15/2016   Alcohol use disorder, moderate, dependence (HCC) 04/14/2016   Hammertoe 03/06/2016   Past Medical History:  Diagnosis Date   Anemia    Anxiety    Depression    Diabetes mellitus without complication (HCC)    dx 09/2019   Fibroids    GERD (gastroesophageal reflux disease)    Hypertension    Kidney stones    Osteoarthritis    Paresthesias    Pinched nerve in neck    Family History  Problem Relation Age of Onset   Congestive Heart Failure Maternal Grandmother    Heart disease Father    Hypertension Mother    Heart disease Mother    Diabetes Mother    Past Surgical History:  Procedure Laterality Date    neck sx 03/17/17     ABDOMINAL HYSTERECTOMY     CESAREAN SECTION     x 2   CHOLECYSTECTOMY N/A 01/31/2018   Procedure: LAPAROSCOPIC CHOLECYSTECTOMY;  Surgeon: Lucretia Roers, MD;  Location: [facility];  Service: General;  Laterality: N/A;   COLONOSCOPY WITH PROPOFOL N/A 12/05/2021   Procedure: COLONOSCOPY WITH PROPOFOL;  Surgeon: Lanelle Bal, DO;  Location: [facility];  Service: Endoscopy;  Laterality: N/A;  10:00 / ASA II   NECK SURGERY  2018   POLYPECTOMY  12/05/2021   Procedure: POLYPECTOMY;  Surgeon: Lanelle Bal, DO;  Location: [facility];  Service: Endoscopy;;   TOE SURGERY Left 03/21/2016   TUBAL LIGATION     Social History   Occupational History   Not on file  Tobacco Use   Smoking status: Former    Current packs/day: 0.00    Types: Cigarettes    Quit date: 03/20/2023    Years since quitting: 0.5   Smokeless tobacco: Never   Tobacco comments:    02/02/20  4 a day  Vaping Use   Vaping status: Never Used  Substance and Sexual Activity   Alcohol use: Yes    Alcohol/week: 2.0 standard drinks of alcohol    Types: 2 Glasses of wine per week    Comment: occas   Drug use: No   Sexual activity: Not Currently    Birth control/protection: Surgical    Comment:  hyst

## 2023-09-23 NOTE — Progress Notes (Unsigned)
Functional Pain Scale - descriptive words and definitions  Distressing (6)    Pain is present/unable to complete most ADLs limited by pain/sleep is difficult and active distraction is only marginal. Moderate range order  Average Pain 8   +Driver, -BT, -Dye Allergies.   The esi kicked in about 3 days after -- did not help the back, but did help the leg pain. Her pain is mostly in the buttocks, in the coccyx region and on the left posterior hip and groin.

## 2023-10-09 ENCOUNTER — Ambulatory Visit: Payer: Managed Care, Other (non HMO) | Admitting: Physical Medicine and Rehabilitation

## 2023-10-09 ENCOUNTER — Other Ambulatory Visit: Payer: Self-pay

## 2023-10-09 ENCOUNTER — Telehealth: Payer: Self-pay | Admitting: Physical Medicine and Rehabilitation

## 2023-10-09 DIAGNOSIS — M5416 Radiculopathy, lumbar region: Secondary | ICD-10-CM | POA: Diagnosis not present

## 2023-10-09 MED ORDER — DEXAMETHASONE SODIUM PHOSPHATE 10 MG/ML IJ SOLN
15.0000 mg | Freq: Once | INTRAMUSCULAR | Status: AC
  Administered 2023-10-09: 15 mg

## 2023-10-09 NOTE — Patient Instructions (Signed)

## 2023-10-09 NOTE — Telephone Encounter (Signed)
[employer] forms received. To [employer]. 

## 2023-10-09 NOTE — Progress Notes (Unsigned)
Functional Pain Scale - descriptive words and definitions  Distressing (6)    Pain is present/unable to complete most ADLs limited by pain/sleep is difficult and active distraction is only marginal. Moderate range order  Average Pain 6 177/106 She had shooting pain in her tailbone she stated the previous injection didn't touch that pain and was unsure if it was intend to help  +Driver, -BT, -Dye Allergies.

## 2023-10-10 ENCOUNTER — Encounter: Payer: Self-pay | Admitting: Orthopaedic Surgery

## 2023-10-10 NOTE — Procedures (Signed)
Lumbosacral Transforaminal Epidural Steroid Injection - Sub-Pedicular Approach with Fluoroscopic Guidance  Patient: Christy Russo      Date of Birth: [birth year] MRN: [id number] PCP: Elfredia Nevins, MD      Visit Date: 10/09/2023   Universal Protocol:    Date/Time: 10/09/2023  Consent Given By: the patient  Position: PRONE  Additional Comments: Vital signs were monitored before and after the procedure. Patient was prepped and draped in the usual sterile fashion. The correct patient, procedure, and site was verified.   Injection Procedure Details:   Procedure diagnoses: Lumbar radiculopathy [M54.16]    Meds Administered:  Meds ordered this encounter  Medications   dexamethasone (DECADRON) injection 15 mg    Laterality: Left  Location/Site: L2  Needle:5.0 in., 22 ga.  Short bevel or Quincke spinal needle  Needle Placement: Transforaminal  Findings:    -Comments: Excellent flow of contrast along the nerve, nerve root and into the epidural space.  Procedure Details: After squaring off the end-plates to get a true AP view, the C-arm was positioned so that an oblique view of the foramen as noted above was visualized. The target area is just inferior to the "nose of the scotty dog" or sub pedicular. The soft tissues overlying this structure were infiltrated with 2-3 ml. of 1% Lidocaine without Epinephrine.  The spinal needle was inserted toward the target using a "trajectory" view along the fluoroscope beam.  Under AP and lateral visualization, the needle was advanced so it did not puncture dura and was located close the 6 O'Clock position of the pedical in AP tracterory. Biplanar projections were used to confirm position. Aspiration was confirmed to be negative for CSF and/or blood. A 1-2 ml. volume of Isovue-250 was injected and flow of contrast was noted at each level. Radiographs were obtained for documentation purposes.   After attaining the desired flow of contrast  documented above, a 0.5 to 1.0 ml test dose of 0.25% Marcaine was injected into each respective transforaminal space.  The patient was observed for 90 seconds post injection.  After no sensory deficits were reported, and normal lower extremity motor function was noted,   the above injectate was administered so that equal amounts of the injectate were placed at each foramen (level) into the transforaminal epidural space.   Additional Comments:  No complications occurred Dressing: 2 x 2 sterile gauze and Band-Aid    Post-procedure details: Patient was observed during the procedure. Post-procedure instructions were reviewed.  Patient left the clinic in stable condition.

## 2023-10-10 NOTE — Progress Notes (Signed)
Christy Russo - 57 y.o. female MRN [id number]  Date of birth: Aug 22, 1966  Office Visit Note: Visit Date: 10/09/2023 PCP: Elfredia Nevins, MD Referred by: Elfredia Nevins, MD  Subjective: Chief Complaint  Patient presents with   Lower Back - Pain   HPI:  Christy Russo is a 57 y.o. female who comes in today at the request of Ellin Goodie, FNP and Dr. Doneen Poisson for planned Left L2-3 Lumbar Transforaminal epidural steroid injection with fluoroscopic guidance.  The patient has failed conservative care including home exercise, medications, time and activity modification.  This injection will be diagnostic and hopefully therapeutic.  Please see requesting physician notes for further details and justification.   ROS Otherwise per HPI.  Assessment & Plan: Visit Diagnoses:    ICD-10-CM   1. Lumbar radiculopathy  M54.16 XR C-ARM NO REPORT    Epidural Steroid injection    dexamethasone (DECADRON) injection 15 mg      Plan: No additional findings.   Meds & Orders:  Meds ordered this encounter  Medications   dexamethasone (DECADRON) injection 15 mg    Orders Placed This Encounter  Procedures   XR C-ARM NO REPORT   Epidural Steroid injection    Follow-up: Return for visit to requesting provider as needed.   Procedures: No procedures performed  Lumbosacral Transforaminal Epidural Steroid Injection - Sub-Pedicular Approach with Fluoroscopic Guidance  Patient: Christy Russo      Date of Birth: 08-23-1966 MRN: [id number] PCP: Elfredia Nevins, MD      Visit Date: 10/09/2023   Universal Protocol:    Date/Time: 10/09/2023  Consent Given By: the patient  Position: PRONE  Additional Comments: Vital signs were monitored before and after the procedure. Patient was prepped and draped in the usual sterile fashion. The correct patient, procedure, and site was verified.   Injection Procedure Details:   Procedure diagnoses: Lumbar radiculopathy  [M54.16]    Meds Administered:  Meds ordered this encounter  Medications   dexamethasone (DECADRON) injection 15 mg    Laterality: Left  Location/Site: L2  Needle:5.0 in., 22 ga.  Short bevel or Quincke spinal needle  Needle Placement: Transforaminal  Findings:    -Comments: Excellent flow of contrast along the nerve, nerve root and into the epidural space.  Procedure Details: After squaring off the end-plates to get a true AP view, the C-arm was positioned so that an oblique view of the foramen as noted above was visualized. The target area is just inferior to the "nose of the scotty dog" or sub pedicular. The soft tissues overlying this structure were infiltrated with 2-3 ml. of 1% Lidocaine without Epinephrine.  The spinal needle was inserted toward the target using a "trajectory" view along the fluoroscope beam.  Under AP and lateral visualization, the needle was advanced so it did not puncture dura and was located close the 6 O'Clock position of the pedical in AP tracterory. Biplanar projections were used to confirm position. Aspiration was confirmed to be negative for CSF and/or blood. A 1-2 ml. volume of Isovue-250 was injected and flow of contrast was noted at each level. Radiographs were obtained for documentation purposes.   After attaining the desired flow of contrast documented above, a 0.5 to 1.0 ml test dose of 0.25% Marcaine was injected into each respective transforaminal space.  The patient was observed for 90 seconds post injection.  After no sensory deficits were reported, and normal lower extremity motor function was noted,   the above injectate was administered  so that equal amounts of the injectate were placed at each foramen (level) into the transforaminal epidural space.   Additional Comments:  No complications occurred Dressing: 2 x 2 sterile gauze and Band-Aid    Post-procedure details: Patient was observed during the procedure. Post-procedure instructions  were reviewed.  Patient left the clinic in stable condition.    Clinical History: MRI LUMBAR SPINE WITHOUT CONTRAST   TECHNIQUE: Multiplanar, multisequence MR imaging of the lumbar spine was performed. No intravenous contrast was administered.   COMPARISON:  Radiographs 02/27/2023.  MRI 01/07/2020.   FINDINGS: Segmentation: Conventional anatomy assumed, with the last open disc space designated L5-S1.Concordant with prior imaging.   Alignment:  Stable minimal degenerative anterolisthesis at L5-S1.   Vertebrae: No worrisome osseous lesion, acute fracture or pars defect. The visualized sacroiliac joints appear unremarkable.   Conus medullaris: Extends to the L1 level and appears normal.   Paraspinal and other soft tissues: No significant paraspinal findings. Small renal cysts are noted bilaterally for which no specific follow-up imaging is recommended. 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   Disc levels:   Sagittal images demonstrate no significant disc space findings within the visualized lower thoracic spine.   L1-2: Normal interspace.   L2-3: Stable mild loss of disc height with annular disc bulging eccentric to the left and a broad-based left foraminal disc protrusion. There is improved patency of the left lateral recess and left foramen without definite residual nerve root encroachment. The spinal canal is patent.   L3-4: Stable mild loss of disc height with disc bulging and mild bilateral facet hypertrophy. No significant spinal stenosis or nerve root encroachment.   L4-5: Preserved disc height with stable mild disc bulging, facet and ligamentous hypertrophy. No significant spinal stenosis or nerve root encroachment.   L5-S1: Stable mild loss of disc height with annular disc bulging eccentric to the left and a left annular fissure. Mildly progressive moderate facet hypertrophy accounting for the grade 1 anterolisthesis. Left foraminal narrowing  has mildly increased but appears only mild-to-moderate. The left lateral recess is mildly narrowed. The spinal canal, right lateral recess and right foramen are patent.   IMPRESSION: 1. Compared with the previous MRI from 2020, there is mildly progressive facet hypertrophy at L5-S1 with resulting mild-to-moderate left foraminal narrowing and mild narrowing of the left lateral recess. 2. Interval improvement in left lateral recess and left foraminal narrowing at L2-3 without definite residual nerve root encroachment. 3. No other significant spinal stenosis or nerve root encroachment.     Electronically Signed   By: Carey Bullocks M.D.   On: 08/20/2023 16:02     Objective:  VS:  HT:    WT:   BMI:     BP:   HR: bpm  TEMP: ( )  RESP:  Physical Exam Vitals and nursing note reviewed.  Constitutional:      General: She is not in acute distress.    Appearance: Normal appearance. She is not ill-appearing.  HENT:     Head: Normocephalic and atraumatic.     Right Ear: External ear normal.     Left Ear: External ear normal.  Eyes:     Extraocular Movements: Extraocular movements intact.  Cardiovascular:     Rate and Rhythm: Normal rate.     Pulses: Normal pulses.  Pulmonary:     Effort: Pulmonary effort is normal. No respiratory distress.  Abdominal:     General: There is no distension.     Palpations: Abdomen is soft.  Musculoskeletal:        General: Tenderness present.     Cervical back: Neck supple.     Right lower leg: No edema.     Left lower leg: No edema.     Comments: Patient has good distal strength with no pain over the greater trochanters.  No clonus or focal weakness.  Skin:    Findings: No erythema, lesion or rash.  Neurological:     General: No focal deficit present.     Mental Status: She is alert and oriented to person, place, and time.     Sensory: No sensory deficit.     Motor: No weakness or abnormal muscle tone.     Coordination: Coordination  normal.  Psychiatric:        Mood and Affect: Mood normal.        Behavior: Behavior normal.      Imaging: XR C-ARM NO REPORT  Result Date: 10/09/2023 Please see Notes tab for imaging impression.

## 2023-10-17 ENCOUNTER — Telehealth: Payer: Self-pay | Admitting: Orthopaedic Surgery

## 2023-10-17 NOTE — Telephone Encounter (Signed)
Patient called and said that after the spinal injection she said that she is still in pain in her groin and her legs are very achy. CB#[phone]

## 2023-10-18 ENCOUNTER — Other Ambulatory Visit: Payer: Self-pay

## 2023-10-18 ENCOUNTER — Telehealth: Payer: Self-pay | Admitting: Orthopaedic Surgery

## 2023-10-18 DIAGNOSIS — M5416 Radiculopathy, lumbar region: Secondary | ICD-10-CM

## 2023-10-18 DIAGNOSIS — M7918 Myalgia, other site: Secondary | ICD-10-CM

## 2023-10-18 DIAGNOSIS — G8929 Other chronic pain: Secondary | ICD-10-CM

## 2023-10-18 NOTE — Telephone Encounter (Signed)
Records 09/23/23-present faxed [place] [phone], per patients request

## 2023-10-18 NOTE — Telephone Encounter (Signed)
Referral made with Christell Constant; patient aware

## 2023-10-23 ENCOUNTER — Ambulatory Visit: Payer: Managed Care, Other (non HMO) | Admitting: Orthopaedic Surgery

## 2023-10-25 ENCOUNTER — Telehealth: Payer: Self-pay | Admitting: Orthopedic Surgery

## 2023-10-25 ENCOUNTER — Other Ambulatory Visit (INDEPENDENT_AMBULATORY_CARE_PROVIDER_SITE_OTHER): Payer: Managed Care, Other (non HMO)

## 2023-10-25 ENCOUNTER — Ambulatory Visit: Payer: Managed Care, Other (non HMO) | Admitting: Orthopedic Surgery

## 2023-10-25 ENCOUNTER — Encounter: Payer: Self-pay | Admitting: Orthopedic Surgery

## 2023-10-25 VITALS — BP 178/100 | HR 88 | Ht 64.0 in | Wt 157.6 lb

## 2023-10-25 DIAGNOSIS — M533 Sacrococcygeal disorders, not elsewhere classified: Secondary | ICD-10-CM | POA: Diagnosis not present

## 2023-10-25 DIAGNOSIS — M7062 Trochanteric bursitis, left hip: Secondary | ICD-10-CM | POA: Diagnosis not present

## 2023-10-25 DIAGNOSIS — M5416 Radiculopathy, lumbar region: Secondary | ICD-10-CM

## 2023-10-25 NOTE — Progress Notes (Signed)
Orthopedic Spine Surgery Office Note  Assessment: Patient is a 57 y.o. female with 2 issues:  1) coccydynia 2) left greater trochanteric bursitis   Plan: -Talked about her treatment options for coccydynia.  I explained that she should continue to offload the area.  I discussed pelvic specific PT at [facility] but she was not interested in that as she does not want to commute from [place].  I also discussed a steroid injection to the coccyx area which was she was not interested in.  I also talked about pain management but she did not want to go that route since opioids make her itch.  Finally, I mentioned coccygectomy as a surgical option.  I explained that this is not a great surgery in my opinion.  Many patients still have residual pain after the surgery and it has a high infection rate.  She wanted to see a second opinion so a referral was provided to her today -For her greater trochanteric bursitis, discussed injection as a treatment option for her.  She wanted proceed with that today.  This was done today in the office -Would need a new A1c that is less than 7.5 prior to any elective surgery.  She would also need to be nicotine free -Patient should return to office on an as-needed basis   Left greater trochanteric bursa injection procedure note: After discussing the risk, benefits, and alternatives of left greater trochanteric bursa injection, patient elected to proceed.  The patient was in the lateral decubitus position with the left hip up.  The skin over the greater trochanteric bursa was sterilized with alcohol based prep.  The skin was anesthetized with ethyl chloride.  A 20-gauge needle was used to inject 1 cc of bupivacaine, 1 cc of lidocaine, 1 cc of Depo-Medrol under standard sterile technique.  Needle was withdrawn and Band-Aid was applied.  Patient tolerated procedure well.  ___________________________________________________________________________   History:  Patient is a 57  y.o. female who presents today for coccyx pain and left lateral hip pain.  Patient states that since January of this year she has noted pain in her tailbone and in the left lateral hip.  She also has left groin pain.  She says that pain has gotten progressively worse with time.  There is no trauma or injury that preceded the onset of pain.  She has tried multiple conservative treatments but has not noted any relief with them.  She has modified her work and car seats to offload the coccyx area.  She does not have any pain rating down either lower extremity.  Treatments tried: Massage, PT, Tylenol, steroid injection, tramadol, meloxicam  Review of systems: Denies fevers and chills, night sweats, unexplained weight loss, history of cancer.  Has had pain that wakes her at night  Past medical history: Depression/anxiety HTN HLD GERD Neuropathy Diabetes Chronic pain  Allergies: Opioids (itching)  Past surgical history:  Cervical spine surgery Hysterectomy C-section Cholecystectomy Tubal ligation Polypectomy  Social history: Reports use of nicotine product (smoking, vaping, patches, smokeless) Alcohol use: Yes, approximately 4 drinks per week Denies recreational drug use   Physical Exam:  BMI of 27.1  General: no acute distress, appears stated age Neurologic: alert, answering questions appropriately, following commands Respiratory: unlabored breathing on room air, symmetric chest rise Psychiatric: appropriate affect, normal cadence to speech   MSK (spine):  -Strength exam      Left  Right EHL    5/5  5/5 TA    5/5  5/5 GSC  5/5  5/5 Knee extension  5/5  5/5 Hip flexion   5/5  5/5  -Sensory exam    Sensation intact to light touch in L3-S1 nerve distributions of bilateral lower extremities  -Achilles DTR: 2/4 on the left, 2/4 on the right -Patellar tendon DTR: 2/4 on the left, 2/4 on the right  -Straight leg raise: Negative bilaterally -Femoral nerve stretch  test: Negative bilaterally -Clonus: no beats bilaterally  -Left hip exam: No pain through range of motion, positive Stinchfield, negative FABER, TTP over the greater trochanter, no other tenderness palpation over the remainder of the hip  -Right hip exam: No pain through range of motion  -TTP over the coccyx  Imaging: XRs of the lumbar spine from 10/25/2023 was independently reviewed and interpreted, showing disc height loss at L5/S1.  No other significant degenerative changes seen.  No evidence of instability on flexion/extension views.  No fracture or dislocation seen.  MRI of the lumbar spine from 08/14/2023 was independently reviewed and interpreted, showing mild left L5/S1 foraminal stenosis.  No other significant stenosis seen.   Patient name: Christy Russo Patient MRN: [id number] Date of visit: 10/25/23

## 2023-10-25 NOTE — Addendum Note (Signed)
Addended by: Willia Craze on: 10/25/2023 05:13 PM   Modules accepted: Orders

## 2023-10-25 NOTE — Telephone Encounter (Signed)
Fax 10/25/23 progress note to [place] @ [facility] [phone]

## 2023-10-28 ENCOUNTER — Telehealth: Payer: Self-pay | Admitting: Orthopedic Surgery

## 2023-10-28 DIAGNOSIS — M533 Sacrococcygeal disorders, not elsewhere classified: Secondary | ICD-10-CM

## 2023-10-28 NOTE — Telephone Encounter (Signed)
Patient called. Would like to have the injections done. Her cb# is [phone]

## 2023-10-29 NOTE — Telephone Encounter (Signed)
Note faxed to [facility]

## 2023-11-06 ENCOUNTER — Ambulatory Visit (INDEPENDENT_AMBULATORY_CARE_PROVIDER_SITE_OTHER): Payer: Managed Care, Other (non HMO) | Admitting: Physical Medicine and Rehabilitation

## 2023-11-06 ENCOUNTER — Other Ambulatory Visit: Payer: Self-pay

## 2023-11-06 DIAGNOSIS — M533 Sacrococcygeal disorders, not elsewhere classified: Secondary | ICD-10-CM | POA: Diagnosis not present

## 2023-11-06 NOTE — Progress Notes (Signed)
Christy Russo - 57 y.o. female MRN [id number]  Date of birth: [birth year]  Office Visit Note: Visit Date: 11/06/2023 PCP: Elfredia Nevins, MD Referred by: London Sheer, MD  Subjective: Chief Complaint  Patient presents with   Tailbone Pain   HPI:  Christy Russo is a 57 y.o. female who comes in today at the request of Dr. Willia Craze for planned coccyx injection with fluoroscopic guidance.  The patient has failed conservative care including home exercise, medications, time and activity modification.  This injection will be diagnostic and hopefully therapeutic.  Please see requesting physician notes for further details and justification.   ROS Otherwise per HPI.  Assessment & Plan: Visit Diagnoses:    ICD-10-CM   1. Coccydynia  M53.3 Coccyx    XR C-ARM NO REPORT      Plan: No additional findings.   Meds & Orders: No orders of the defined types were placed in this encounter.   Orders Placed This Encounter  Procedures   Coccyx   XR C-ARM NO REPORT    Follow-up: Return for visit to requesting provider as needed.   Procedures: Coccyx on 11/06/2023 3:28 PM Indications: diagnostic evaluation and pain Details: 22 G 3.5 in needle, fluoroscopy-guided posterior approach  Arthrogram: No  Medications: 4 mL bupivacaine 0.25 %; 40 mg triamcinolone acetonide 40 MG/ML Outcome: tolerated well, no immediate complications  Good flow of contrast outlining the C1-2 disc and coccyx without vascular flow. Procedure, treatment alternatives, risks and benefits explained, specific risks discussed. Consent was given by the patient. Immediately prior to procedure a time out was called to verify the correct patient, procedure, equipment, support staff and site/side marked as required. Patient was prepped and draped in the usual sterile fashion.          Clinical History: MRI LUMBAR SPINE WITHOUT CONTRAST   TECHNIQUE: Multiplanar, multisequence MR imaging of the lumbar  spine was performed. No intravenous contrast was administered.   COMPARISON:  Radiographs 02/27/2023.  MRI 01/07/2020.   FINDINGS: Segmentation: Conventional anatomy assumed, with the last open disc space designated L5-S1.Concordant with prior imaging.   Alignment:  Stable minimal degenerative anterolisthesis at L5-S1.   Vertebrae: No worrisome osseous lesion, acute fracture or pars defect. The visualized sacroiliac joints appear unremarkable.   Conus medullaris: Extends to the L1 level and appears normal.   Paraspinal and other soft tissues: No significant paraspinal findings. Small renal cysts are noted bilaterally for which no specific follow-up imaging is recommended. 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   Disc levels:   Sagittal images demonstrate no significant disc space findings within the visualized lower thoracic spine.   L1-2: Normal interspace.   L2-3: Stable mild loss of disc height with annular disc bulging eccentric to the left and a broad-based left foraminal disc protrusion. There is improved patency of the left lateral recess and left foramen without definite residual nerve root encroachment. The spinal canal is patent.   L3-4: Stable mild loss of disc height with disc bulging and mild bilateral facet hypertrophy. No significant spinal stenosis or nerve root encroachment.   L4-5: Preserved disc height with stable mild disc bulging, facet and ligamentous hypertrophy. No significant spinal stenosis or nerve root encroachment.   L5-S1: Stable mild loss of disc height with annular disc bulging eccentric to the left and a left annular fissure. Mildly progressive moderate facet hypertrophy accounting for the grade 1 anterolisthesis. Left foraminal narrowing has mildly increased but appears only mild-to-moderate. The left lateral  recess is mildly narrowed. The spinal canal, right lateral recess and right foramen are patent.    IMPRESSION: 1. Compared with the previous MRI from 2020, there is mildly progressive facet hypertrophy at L5-S1 with resulting mild-to-moderate left foraminal narrowing and mild narrowing of the left lateral recess. 2. Interval improvement in left lateral recess and left foraminal narrowing at L2-3 without definite residual nerve root encroachment. 3. No other significant spinal stenosis or nerve root encroachment.     Electronically Signed   By: Carey Bullocks M.D.   On: 08/20/2023 16:02     Objective:  VS:  HT:    WT:   BMI:     BP:   HR: bpm  TEMP: ( )  RESP:  Physical Exam   Imaging: No results found.

## 2023-11-09 MED ORDER — BUPIVACAINE HCL 0.25 % IJ SOLN
4.0000 mL | INTRAMUSCULAR | Status: AC | PRN
Start: 2023-11-06 — End: 2023-11-06
  Administered 2023-11-06: 4 mL via INTRA_ARTICULAR

## 2023-11-09 MED ORDER — TRIAMCINOLONE ACETONIDE 40 MG/ML IJ SUSP
40.0000 mg | INTRAMUSCULAR | Status: AC | PRN
Start: 2023-11-06 — End: 2023-11-06
  Administered 2023-11-06: 40 mg via INTRA_ARTICULAR

## 2023-12-05 ENCOUNTER — Encounter (HOSPITAL_COMMUNITY): Payer: Self-pay

## 2023-12-05 ENCOUNTER — Telehealth: Payer: Self-pay | Admitting: Physical Medicine and Rehabilitation

## 2023-12-05 ENCOUNTER — Ambulatory Visit (HOSPITAL_COMMUNITY): Payer: 59 | Admitting: Clinical

## 2023-12-05 DIAGNOSIS — F419 Anxiety disorder, unspecified: Secondary | ICD-10-CM

## 2023-12-05 DIAGNOSIS — F331 Major depressive disorder, recurrent, moderate: Secondary | ICD-10-CM | POA: Diagnosis not present
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
## 2023-12-05 NOTE — Telephone Encounter (Signed)
Patient called and wanted to get on the schedule for steroid shot. CB#[phone]

## 2023-12-06 NOTE — Telephone Encounter (Signed)
I called, left voicemail for return call. Patient has had lumbar ESI as well as coccyx injection.

## 2023-12-17 ENCOUNTER — Telehealth (HOSPITAL_COMMUNITY): Payer: Self-pay

## 2023-12-17 NOTE — Telephone Encounter (Signed)
 Pt's referring provider at [facility] sent a fax request to get medical records. Reach out to referring office advised them that we would need a release of information form sent in. Called pt to let her know as well no answer left vm to return call

## 2023-12-27 ENCOUNTER — Ambulatory Visit (INDEPENDENT_AMBULATORY_CARE_PROVIDER_SITE_OTHER): Payer: 59 | Admitting: Clinical

## 2023-12-27 DIAGNOSIS — F419 Anxiety disorder, unspecified: Secondary | ICD-10-CM | POA: Diagnosis not present

## 2023-12-27 DIAGNOSIS — F32A Depression, unspecified: Secondary | ICD-10-CM | POA: Diagnosis not present

## 2023-12-27 NOTE — Progress Notes (Signed)
Virtual Visit via Video Note  I connected with Christy Russo on 12/27/23 at  8:00 AM EST by a video enabled telemedicine application and verified that I am speaking with the correct person using two identifiers.  Location: Patient: home Provider: office   I discussed the limitations of evaluation and management by telemedicine and the availability of in person appointments. The patient expressed understanding and agreed to proceed.   THERAPIST PROGRESS NOTE   Session Time: 8:00 AM-8:45 AM    Participation Level: Active   Behavioral Response: CasualAlertDepressed   Type of Therapy: Individual Therapy   Treatment Goals addressed: Coping for Depression and Anxiety   Interventions: CBT   Summary: Christy Russo is a 58 y.o. female who presents with Bipolar Disorder./ eating disorder. The OPT therapist worked with the patient for her scheduled OPT session. The OPT therapist utilized Motivational Interviewing to assist in creating therapeutic repore. The patient in the session was engaged and work in collaboration giving feedback about her triggers and symptoms over the past few weeks through the holidays and into the beginning of the 2025 year. The patient spoke about the beginning of January being like a ongoing extension of the holidays. The patient spoke about her work being on 3rd shift causing her to neglect her basic needs. The patient spoke about upcoming interview for job change doing the same work but transitioning to 1st shift.The OPT therapist utilized Cognitive Behavioral Therapy through cognitive restructuring as well as worked with the patient on coping strategies to assist in management of mood and  work on sleep cycle, family interactions,  finances, physical and mental health..The OPT therapist worked with the patient around mindfulness, accountability, and time management. The OPT therapist worked with the patient on incorporating her basic needs sleep cycle, eating  habits, hygiene, and physical exercise into her existing daily routine.The patient noted she is struggling with sciatic nerve pain and has a appointment for this later today in [place]. The OPT therapist worked with the patient on challenging negative thoughts.   Suicidal/Homicidal: Nowithout intent/plan   Therapist Response: The OPT therapist worked with the patient for the patients scheduled session. The patient was engaged in her session and gave feedback in relation to triggers, symptoms, and behavior responses over the past few weeks. The OPT therapist worked with the patient utilizing an in session Cognitive Behavioral Therapy exercise. The patient was responsive in the session and verbalized, " I have a job interview next week its for the same kind of work, but its for 1st shift".   The OPT therapist worked with the patient on managing her own individual health and being mindful of her own basic care needs including eating, sleeping, exercise, and hygiene. The patient spoke about in her living community having access to the gym and during the Summer the pool. The patient spoke about upcoming interview for a 1st shift position she is hoping it will offer around the same amount of money and retirement benifits .The OPT therapist will continue treatment work with the patient in her next scheduled session.   Plan: Return again in 3 weeks.   Diagnosis:      Axis I: Depression with Anxiety                           Axis II: No diagnosis   Collaboration of Care: No additional collaboration of care for this session.    Patient/Guardian was advised Release of Information  must be obtained prior to any record release in order to collaborate their care with an outside provider. Patient/Guardian was advised if they have not already done so to contact the registration department to sign all necessary forms in order for [place] to release information regarding their care.    Consent: Patient/Guardian gives  verbal consent for treatment and assignment of benefits for services provided during this visit. Patient/Guardian expressed understanding and agreed to proceed      I discussed the assessment and treatment plan with the patient. The patient was provided an opportunity to ask questions and all were answered. The patient agreed with the plan and demonstrated an understanding of the instructions.   The patient was advised to call back or seek an in-person evaluation if the symptoms worsen or if the condition fails to improve as anticipated.   I provided 45 minutes of non-face-to-face time during this encounter.   Winfred Burn, LCSW   12/27/2023

## 2024-01-24 ENCOUNTER — Encounter (HOSPITAL_COMMUNITY): Payer: Self-pay

## 2024-01-24 ENCOUNTER — Ambulatory Visit (HOSPITAL_COMMUNITY): Payer: 59 | Admitting: Clinical

## 2024-01-24 DIAGNOSIS — F419 Anxiety disorder, unspecified: Secondary | ICD-10-CM

## 2024-01-24 DIAGNOSIS — F32A Depression, unspecified: Secondary | ICD-10-CM

## 2024-01-24 DIAGNOSIS — F331 Major depressive disorder, recurrent, moderate: Secondary | ICD-10-CM

## 2024-01-24 NOTE — Progress Notes (Signed)
Virtual Visit via Video Note   I connected with Christy Russo on 01/24/24 at  10:00 AM EST by a video enabled telemedicine application and verified that I am speaking with the correct person using two identifiers.   Location: Patient: home Provider: office   I discussed the limitations of evaluation and management by telemedicine and the availability of in person appointments. The patient expressed understanding and agreed to proceed.     THERAPIST PROGRESS NOTE   Session Time: 10:00 AM-10:35 AM    Participation Level: Active   Behavioral Response: CasualAlertDepressed   Type of Therapy: Individual Therapy   Treatment Goals addressed: Coping for Depression and Anxiety   Interventions: CBT   Summary: Christy Russo is a 58 y.o. female who presents with Bipolar Disorder./ eating disorder. The OPT therapist worked with the patient for her scheduled OPT session. The OPT therapist utilized Motivational Interviewing to assist in creating therapeutic repore. The patient in the session was engaged and work in collaboration giving feedback about her triggers and symptoms over the past few weeks through the holidays and into the beginning of the 2025 year. The patient spoke about being being denied for her long term disability and she is in the appeal stage.The patient is currently working with an attorney to get her appeal approved for long term disability. The patient noted she feels strongly she will be terminated ultimately. The patient spoke about being frustrated after getting her short term disability approved and then having her long term disability denied.The OPT therapist utilized Cognitive Behavioral Therapy through cognitive restructuring as well as worked with the patient on coping strategies to assist in management of mood and  work on sleep cycle, family interactions,  finances, physical and mental health.The OPT therapist worked with the patient around mindfulness,  accountability, and time management. The OPT therapist worked with the patient on incorporating her basic needs sleep cycle, eating habits, hygiene, and physical exercise into her existing daily routine.The patient noted she is struggling with sciatic nerve pain does not feel she can return to work and she has a Social worker office who has taken her case.   Suicidal/Homicidal: Nowithout intent/plan   Therapist Response: The OPT therapist worked with the patient for the patients scheduled session. The patient was engaged in her session and gave feedback in relation to triggers, symptoms, and behavior responses over the past few weeks. The OPT therapist worked with the patient utilizing an in session Cognitive Behavioral Therapy exercise. The patient was responsive in the session and verbalized, " I have not been formally terminated but my long term disability was denied and a law group has taken my case and we have filed an appeal after my short term disability was approved , but the work place denied my long term disability".   The OPT therapist worked with the patient on managing her own individual health and being mindful of her own basic care needs including eating, sleeping, exercise, and hygiene. The patient spoke about in her living community having access to the gym and during the Summer the pool. The patient spoke about deciding to not take interview for a 1st shift position for another employer .The patient spoke about understanding she is in a transitional period and the outcome of her disability case will have large implications for her financial situation moving forward.The OPT therapist will continue treatment work with the patient in her next scheduled session.   Plan: Return again in 3 weeks.   Diagnosis:  Axis I: Depression with Anxiety                           Axis II: No diagnosis   Collaboration of Care: No additional collaboration of care for this session.    Patient/Guardian was  advised Release of Information must be obtained prior to any record release in order to collaborate their care with an outside provider. Patient/Guardian was advised if they have not already done so to contact the registration department to sign all necessary forms in order for [place] to release information regarding their care.    Consent: Patient/Guardian gives verbal consent for treatment and assignment of benefits for services provided during this visit. Patient/Guardian expressed understanding and agreed to proceed      I discussed the assessment and treatment plan with the patient. The patient was provided an opportunity to ask questions and all were answered. The patient agreed with the plan and demonstrated an understanding of the instructions.   The patient was advised to call back or seek an in-person evaluation if the symptoms worsen or if the condition fails to improve as anticipated.   I provided 35 minutes of non-face-to-face time during this encounter.   Winfred Burn, LCSW   01/24/2024

## 2024-02-21 ENCOUNTER — Ambulatory Visit (HOSPITAL_COMMUNITY): Payer: 59 | Admitting: Clinical

## 2024-07-31 ENCOUNTER — Encounter: Payer: Self-pay | Admitting: Radiology

## 2024-10-12 ENCOUNTER — Encounter: Payer: Self-pay | Admitting: Radiology

## 2024-10-28 ENCOUNTER — Ambulatory Visit (HOSPITAL_COMMUNITY): Admitting: Clinical

## 2025-02-23 ENCOUNTER — Ambulatory Visit: Payer: Self-pay | Admitting: Physician Assistant
